# Patient Record
Sex: Female | Born: 1965 | Race: Black or African American | Hispanic: No | Marital: Single | Smoking: Never smoker
Health system: Southern US, Community
[De-identification: ages and names within clinical notes are randomized; demographics above are authoritative.]

## PROBLEM LIST (undated history)

## (undated) DIAGNOSIS — R7303 Prediabetes: Secondary | ICD-10-CM

## (undated) DIAGNOSIS — E785 Hyperlipidemia, unspecified: Secondary | ICD-10-CM

## (undated) DIAGNOSIS — F32A Depression, unspecified: Secondary | ICD-10-CM

## (undated) DIAGNOSIS — I10 Essential (primary) hypertension: Secondary | ICD-10-CM

## (undated) DIAGNOSIS — E739 Lactose intolerance, unspecified: Secondary | ICD-10-CM

## (undated) DIAGNOSIS — E7800 Pure hypercholesterolemia, unspecified: Secondary | ICD-10-CM

## (undated) DIAGNOSIS — F419 Anxiety disorder, unspecified: Secondary | ICD-10-CM

## (undated) DIAGNOSIS — R002 Palpitations: Secondary | ICD-10-CM

## (undated) DIAGNOSIS — E669 Obesity, unspecified: Secondary | ICD-10-CM

## (undated) DIAGNOSIS — G4730 Sleep apnea, unspecified: Secondary | ICD-10-CM

## (undated) DIAGNOSIS — E559 Vitamin D deficiency, unspecified: Secondary | ICD-10-CM

## (undated) HISTORY — DX: Sleep apnea, unspecified: G47.30

## (undated) HISTORY — DX: Lactose intolerance, unspecified: E73.9

## (undated) HISTORY — DX: Anxiety disorder, unspecified: F41.9

## (undated) HISTORY — DX: Palpitations: R00.2

## (undated) HISTORY — DX: Depression, unspecified: F32.A

## (undated) HISTORY — DX: Hyperlipidemia, unspecified: E78.5

## (undated) HISTORY — DX: Obesity, unspecified: E66.9

## (undated) HISTORY — DX: Prediabetes: R73.03

## (undated) HISTORY — DX: Essential (primary) hypertension: I10

## (undated) HISTORY — DX: Pure hypercholesterolemia, unspecified: E78.00

## (undated) HISTORY — DX: Vitamin D deficiency, unspecified: E55.9

---

## 2008-05-30 ENCOUNTER — Encounter: Admission: RE | Admit: 2008-05-30 | Discharge: 2008-05-30 | Payer: Self-pay | Admitting: Internal Medicine

## 2009-03-30 ENCOUNTER — Encounter: Admission: RE | Admit: 2009-03-30 | Discharge: 2009-03-30 | Payer: Self-pay | Admitting: Internal Medicine

## 2009-06-12 ENCOUNTER — Encounter: Admission: RE | Admit: 2009-06-12 | Discharge: 2009-06-12 | Payer: Self-pay | Admitting: Internal Medicine

## 2010-04-30 ENCOUNTER — Encounter: Admission: RE | Admit: 2010-04-30 | Discharge: 2010-04-30 | Payer: Self-pay | Admitting: Family Medicine

## 2010-07-01 ENCOUNTER — Encounter: Admission: RE | Admit: 2010-07-01 | Discharge: 2010-07-01 | Payer: Self-pay | Admitting: Internal Medicine

## 2011-06-12 ENCOUNTER — Other Ambulatory Visit: Payer: Self-pay | Admitting: Internal Medicine

## 2011-06-12 DIAGNOSIS — Z1231 Encounter for screening mammogram for malignant neoplasm of breast: Secondary | ICD-10-CM

## 2011-07-15 ENCOUNTER — Ambulatory Visit
Admission: RE | Admit: 2011-07-15 | Discharge: 2011-07-15 | Disposition: A | Discharge status: Home / Self Care | Payer: BC Managed Care – PPO | Source: Ambulatory Visit | Attending: Internal Medicine | Admitting: Internal Medicine

## 2011-07-15 DIAGNOSIS — Z1231 Encounter for screening mammogram for malignant neoplasm of breast: Secondary | ICD-10-CM

## 2011-09-11 ENCOUNTER — Ambulatory Visit (INDEPENDENT_AMBULATORY_CARE_PROVIDER_SITE_OTHER): Payer: BC Managed Care – PPO

## 2011-09-11 DIAGNOSIS — L501 Idiopathic urticaria: Secondary | ICD-10-CM

## 2014-05-01 ENCOUNTER — Ambulatory Visit
Admission: RE | Admit: 2014-05-01 | Discharge: 2014-05-01 | Disposition: A | Discharge status: Home / Self Care | Payer: BC Managed Care – PPO | Source: Ambulatory Visit | Attending: Orthopedic Surgery | Admitting: Orthopedic Surgery

## 2014-05-01 ENCOUNTER — Other Ambulatory Visit: Payer: Self-pay | Admitting: Orthopedic Surgery

## 2014-05-01 DIAGNOSIS — R52 Pain, unspecified: Secondary | ICD-10-CM

## 2014-05-23 ENCOUNTER — Other Ambulatory Visit: Payer: Self-pay | Admitting: Family Medicine

## 2014-05-23 DIAGNOSIS — Z1239 Encounter for other screening for malignant neoplasm of breast: Secondary | ICD-10-CM

## 2014-06-08 ENCOUNTER — Other Ambulatory Visit: Payer: Self-pay | Admitting: Family Medicine

## 2014-06-08 DIAGNOSIS — Z1231 Encounter for screening mammogram for malignant neoplasm of breast: Secondary | ICD-10-CM

## 2014-06-09 ENCOUNTER — Ambulatory Visit: Payer: BC Managed Care – PPO | Admitting: Dietician

## 2014-06-12 ENCOUNTER — Ambulatory Visit
Admission: RE | Admit: 2014-06-12 | Discharge: 2014-06-12 | Disposition: A | Discharge status: Home / Self Care | Payer: BC Managed Care – PPO | Source: Ambulatory Visit | Attending: Family Medicine | Admitting: Family Medicine

## 2014-06-12 DIAGNOSIS — Z1231 Encounter for screening mammogram for malignant neoplasm of breast: Secondary | ICD-10-CM

## 2014-07-20 ENCOUNTER — Encounter: Payer: BC Managed Care – PPO | Attending: Family Medicine | Admitting: Dietician

## 2014-07-20 ENCOUNTER — Encounter: Payer: Self-pay | Admitting: Dietician

## 2014-07-20 VITALS — Ht 62.0 in | Wt 251.2 lb

## 2014-07-20 DIAGNOSIS — Z6841 Body Mass Index (BMI) 40.0 and over, adult: Secondary | ICD-10-CM | POA: Insufficient documentation

## 2014-07-20 DIAGNOSIS — E669 Obesity, unspecified: Secondary | ICD-10-CM | POA: Insufficient documentation

## 2014-07-20 DIAGNOSIS — Z713 Dietary counseling and surveillance: Secondary | ICD-10-CM | POA: Insufficient documentation

## 2014-07-20 NOTE — Patient Instructions (Addendum)
-  Continue to avoid buying your trigger foods  -Avoid nuts  -Work on developing a routine  -Stash some healthy, high protein snacks at work: string cheese, triscuits, granola bar, Greek yogurt  -Work on managing stress: gum, walking around, have a healthy snack, find a buddy  -Avoid skipping breakfast  -Try Premier protein shake as meal replacement  -Get back into exercise habit  -Try having steamer bags of veggies at work -[employer] Healthy Choice meals on hand at work and at home  -Healthy weight loss: 1-2 lbs per week  -Bariatric support group: [facility] 17 [facility] 6-7 pm [facility] -[facility]

## 2014-07-20 NOTE — Progress Notes (Signed)
  Medical Nutrition Therapy:  Appt start time: 950 end time:  1045.   Assessment:  Primary concerns today: Angela Stanley reports that she is here to discuss her weight. She has received nutrition education in the past. Has tried Weight Watchers, Herbalife, personal trainer, and phentermine. She is currently unsure about bariatric surgery. 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 Barriers: stress, doesn't eat on a schedule. She states that she tries to exercise regularly, walking on treadmill for 40 minutes this morning. She describes herself as a "grazer," likes crunchy snacks and eats excessive amounts of nuts. Angela Stanley lives alone and does not sleep well at night. However, she does not have sleep apnea.     Preferred Learning Style:  No preference indicated   Learning Readiness:   Ready   MEDICATIONS: none   DIETARY INTAKE:  Avoided foods include mushrooms.    24-hr recall:  Wakes up around 6am  B (9:30-10 AM): sometimes skips, milk, 1 cup fruit, 1/2 banana, chia, oats OR yogurt and oats  Snk (11:30 AM): nuts (too many)  L (3-4 PM): varies, chipotle Snk ( PM): granola bars, cookies D ( PM): varies; peanuts or brown rice and black beans Snk ( PM): nuts  Beverages: coffee with sugar free creamer, water, rarely diet coke   Usual physical activity: inconsistent, treadmill  Estimated energy needs: 1600-1800 calories  Progress Towards Goal(s):  In progress.   Nutritional Diagnosis:  [place]-3.3 Overweight/obesity As related to excessive energy intake.  As evidenced by BMI 46.    Intervention:  Nutrition counseling provided. Answered the patient's questions regarding bariatric surgery. Discussed post-op diet progression.  -Continue to avoid buying your trigger foods  -Avoid nuts -Work on developing a routine  -Stash some healthy, high protein snacks at work: string cheese, triscuits, granola bar, Greek yogurt -Work on managing stress: gum, walking around, have a healthy snack, find  a buddy -Avoid skipping breakfast -Try Premier protein shake as meal replacement -Get back into exercise habit -Try having steamer bags of veggies at work -[employer] Healthy Choice meals on hand at work and at home -Healthy weight loss: 1-2 lbs per week  -Bariatric support group: December 17 from 6-7 pm in [facility] -Seminar: [facility]  Sample provided and patient educated on proper use: Vanilla premier protein shake (qty 1) Lot#: [id number] Exp: 12/2014  Teaching Method Utilized:  Visual Auditory Hands on  Barriers to learning/adherence to lifestyle change: stress  Demonstrated degree of understanding via:  Teach Back   Monitoring/Evaluation:  Dietary intake, exercise, and body weight in 4 week(s).

## 2014-08-23 ENCOUNTER — Encounter: Payer: BLUE CROSS/BLUE SHIELD | Attending: Family Medicine | Admitting: Dietician

## 2014-08-23 VITALS — Wt 248.8 lb

## 2014-08-23 DIAGNOSIS — Z713 Dietary counseling and surveillance: Secondary | ICD-10-CM | POA: Insufficient documentation

## 2014-08-23 DIAGNOSIS — E669 Obesity, unspecified: Secondary | ICD-10-CM | POA: Diagnosis present

## 2014-08-23 DIAGNOSIS — Z6841 Body Mass Index (BMI) 40.0 and over, adult: Secondary | ICD-10-CM | POA: Diagnosis not present

## 2014-08-23 NOTE — Progress Notes (Signed)
  Medical Nutrition Therapy:  Appt start time: 335 end time:  405  Follow up:  Angela Stanley returns today with a 3-lbs weight loss. She reports that she is frustrated with slow weight loss and getting bored with food choices. She likes Premier protein shakes and has them between meals or in place of a meal. Started stashing healthy snacks and healthy choice frozen meals at work and in her car. Having balanced dinners with fish and brown rice. Drinking more water. Still snacking at night (emotional eating) on Triscuits. Getting 10,000 steps a day. Has been sleeping more, taking Melatonin.  Preferred Learning Style:  No preference indicated   Learning Readiness:   Ready   MEDICATIONS: none   DIETARY INTAKE:  Avoided foods include mushrooms.    24-hr recall:  Wakes up around 6am  B (9:30-10 AM): Premier protein shake with fruit cup Snk (11:30 AM): 6 Triscuits and string cheese OR KIND bar OR pistachio OR yogurt L (3-4 PM): Healthy Choice frozen meal Snk ( PM): *see morning snack options D ( PM): fish and brown rice OR Premier protein shake Snk ( PM): Triscuits (habitual, boredom)  Beverages: coffee with sugar free creamer, water  Usual physical activity: walking more overall (10,000 steps every day)  Estimated energy needs: 1600-1800 calories  Progress Towards Goal(s):  In progress.   Nutritional Diagnosis:  [place]-3.3 Overweight/obesity As related to excessive energy intake.  As evidenced by BMI 46.    Intervention:  Nutrition counseling provided.   Teaching Method Utilized:  Visual Auditory  Barriers to learning/adherence to lifestyle change: stress  Demonstrated degree of understanding via:  Teach Back   Monitoring/Evaluation:  Dietary intake, exercise, and body weight in 5 week(s).

## 2014-08-23 NOTE — Patient Instructions (Addendum)
-  Continue to avoid buying your trigger foods  -Work on managing stress: gum, walking around, have a healthy snack, find a buddy  -Get back into exercise habit   -Try some sugarfree drinks (sparkling water with fruit pieces) in the evenings for an after dinner treat -Popcorn with smart balance butter in the evenings *eat it very slowly and enjoy it!  -Healthy weight loss: 1-2 lbs per week

## 2014-09-27 ENCOUNTER — Ambulatory Visit: Payer: Self-pay | Admitting: Dietician

## 2014-10-18 ENCOUNTER — Encounter: Payer: BLUE CROSS/BLUE SHIELD | Attending: Family Medicine | Admitting: Dietician

## 2014-10-18 DIAGNOSIS — Z6841 Body Mass Index (BMI) 40.0 and over, adult: Secondary | ICD-10-CM | POA: Diagnosis not present

## 2014-10-18 DIAGNOSIS — Z713 Dietary counseling and surveillance: Secondary | ICD-10-CM | POA: Insufficient documentation

## 2014-10-18 DIAGNOSIS — E669 Obesity, unspecified: Secondary | ICD-10-CM | POA: Diagnosis not present

## 2014-10-18 NOTE — Progress Notes (Signed)
  Medical Nutrition Therapy:  Appt start time: 950 end time:  1025  Follow up:  Angela Stanley returns today with a 1.5 lbs weight loss. She is frustrated and upset about her rate of weight loss. She reports she has been drinking more water but still not enough. Staying more hydrated has helped her cut down on snacking. She is also sleeping better. Now has a fitbit and is tracking foods and steps. Resting heart rate has decreased. Getting 10,000 steps per day. Keeping less trigger foods in the house; however, junk food at work is tempting.   Wt Readings from Last 3 Encounters:  10/18/14 247 lb 6.4 oz (112.22 kg)  08/23/14 248 lb 12.8 oz (112.855 kg)  07/20/14 251 lb 3.2 oz (113.944 kg)   Ht Readings from Last 3 Encounters:  07/20/14 5\' 2"  (1.575 m)   Body mass index is 45.24 kg/(m^2). @BMIFA @ Normalized weight-for-age data available only for age 83 to 20 years. Normalized stature-for-age data available only for age 83 to 20 years.    Preferred Learning Style:  No preference indicated   Learning Readiness:   Ready   MEDICATIONS: none   DIETARY INTAKE:  Avoided foods include mushrooms.    24-hr recall:  Wakes up around 6am  2 cups coffee and sometimes 2-3 cups water B (9 AM): Premier protein shake OR 100 calorie AustriaGreek yogurt with muesli Snk (11:30 AM): 6 Triscuits and string cheese L (3-4 PM): veggie steamer with salmon Snk ( PM): pistachios, sometimes "junk" D (7:30 PM): omelet with Ezekiel bread or salad with chicken Snk ( PM):   Beverages: coffee with sugar free creamer, water  Usual physical activity: walking more overall (10,000 steps every day)  Estimated energy needs: 1600-1800 calories  Progress Towards Goal(s):  In progress.   Nutritional Diagnosis:  [place]-3.3 Overweight/obesity As related to excessive energy intake.  As evidenced by BMI 46.    Intervention:  Nutrition counseling provided.   Teaching Method Utilized:  Visual Auditory  Barriers to  learning/adherence to lifestyle change: stress  Demonstrated degree of understanding via:  Teach Back   Monitoring/Evaluation:  Dietary intake, exercise, and body weight in 6 week(s).

## 2014-10-18 NOTE — Patient Instructions (Signed)
-  Continue to avoid buying your trigger foods  -Work on managing stress: gum, walking around, have a healthy snack, find a buddy  -Get back into exercise habit   -Try some sugarfree drinks (sparkling water with fruit pieces) in the evenings for an after dinner treat -Popcorn with smart balance butter in the evenings *eat it very slowly and enjoy it!  -Healthy weight loss: 1-2 lbs per week

## 2014-11-27 ENCOUNTER — Ambulatory Visit: Payer: BLUE CROSS/BLUE SHIELD | Admitting: Dietician

## 2014-12-21 ENCOUNTER — Ambulatory Visit: Payer: BLUE CROSS/BLUE SHIELD | Admitting: Dietician

## 2018-01-25 ENCOUNTER — Encounter: Payer: 59 | Attending: Internal Medicine | Admitting: Registered"

## 2018-01-25 ENCOUNTER — Encounter: Payer: Self-pay | Admitting: Registered"

## 2018-01-25 DIAGNOSIS — Z713 Dietary counseling and surveillance: Secondary | ICD-10-CM

## 2018-01-25 DIAGNOSIS — E7800 Pure hypercholesterolemia, unspecified: Secondary | ICD-10-CM | POA: Insufficient documentation

## 2018-01-25 NOTE — Progress Notes (Signed)
Medical Nutrition Therapy:  Appt start time: 0835 end time:  0925.  Assessment:  Primary concerns today: Patient states her cholesterol and blood pressure have gone up. Patient states she plans to start taking fish oil to raise HDL. Per labs patient shared: LDL 196; HDL 52;   Patient states that She has been doing Weight Watchers since ~Jan and going to weekly meetings until the last couple of months. Patient states she had lost a little weight in the beginning, but negligible amount recently even when exercising and staying within the point system.   Pt states she has had a chronic cough at night and was assuming it was due to her cats until she noticed her cough went away after she stopped eating Triscuits and Eziekiel bread. Pt states she does not have celiac disease. It could indicate a wheat allergy, but mold allergy may be another explanation. Pt listed mushrooms on her allergy list which could indicate a mold allergy. Both Trisuits and Eziekiel bread do not use preservatives, so it is possible she is getting mold from these sources. RD would like to address this at next visit.  Pt states her sleep is "horrible" and per her fit bit she only got 3 hrs of sleep last night, and rarely will get 8 hrs. Pt states she wakes anywhere from 2:30 - 4am and has a difficult time getting back to sleep. Pt states since Jan she has been using a meditation app in the morning which has helped some.  Patient states her mother is retired, lives with her and has a meal prepared every night. Patient states they are from [place] so some of the foods are traditional with rice or potato (keeping serving size to 1 c), meat and vegetables. Pt states her mother also uses olive oil and they eat avocados regularly.  Patient states she enjoys nuts (almonds), but can't stop at the serving size (100 cal pack) so she just gets nuts for her mother, but avoids them for herself.  Preferred Learning Style:   No preference indicated    Learning Readiness:   Ready  MEDICATIONS: reviewed   DIETARY INTAKE:  Everyday foods include rice, avocadoes.  Avoided foods include (doesn't like veggies but eats them).    24-hr recall:  B (8 AM): blk coffee sometimes stevia, 2 eggs, spinach, olive oil OR oats OR sardines, toast, avocado Snk ( AM): fruit  L ( PM): rice or potato (1 c), steamed veggies (doesn't like), meat Snk ( PM): low energy (2-6pm) snacking on "junk" D (7:15 PM): same as lunch Snk ( PM): none (depends on stress) fruits Beverages: coffee, ~4 water, zevia, Fridays 1-2 glasses of wine  Usual physical activity: walking 5x week 45-60 min   Estimated energy needs: 1800 calories  Progress Towards Goal(s):  New goal.   Nutritional Diagnosis:  [place]-5.8.4 Inconsistent carbohydrate intake As related to high carbodrate snacks to combat low energy in afternoons.  As evidenced by dietary recall.    Intervention:  Nutrition Education. Discussed how inadequate sleep affects energy level and what a person is driven to eat. Discussed role of stress in over all health as well as relationship to food. Discussed the research behind [employer] and realistic weight loss expectations with any commercial program.  Plan: MBSR 8 week course: https://palousemindfulness.com/  Look into the [place] non-hunger eating class Use the Sleep tip sheet, also use the blue light filter on your phone Consider using glass to re-heat food  Teaching Method Utilized:  Visual Auditory Hands on  Handouts given during visit include:  Sleep Hygiene  Barriers to learning/adherence to lifestyle change: none  Demonstrated degree of understanding via:  Teach Back   Monitoring/Evaluation:  Dietary intake, exercise, and body weight in 4 week(s).

## 2018-01-25 NOTE — Patient Instructions (Addendum)
[email or web address].[email or web address] MBSR 8 week course Look into the [place] non-hunger eating class Use the Sleep tip sheet, also use the blue light filter on your phone Consider using glass to re-heat food

## 2018-03-01 ENCOUNTER — Ambulatory Visit: Payer: 59 | Admitting: Registered"

## 2018-03-17 ENCOUNTER — Ambulatory Visit: Payer: Self-pay | Admitting: Registered"

## 2018-04-20 DIAGNOSIS — H9313 Tinnitus, bilateral: Secondary | ICD-10-CM | POA: Diagnosis not present

## 2018-04-20 DIAGNOSIS — H8101 Meniere's disease, right ear: Secondary | ICD-10-CM | POA: Diagnosis not present

## 2018-04-20 DIAGNOSIS — H903 Sensorineural hearing loss, bilateral: Secondary | ICD-10-CM | POA: Diagnosis not present

## 2018-05-11 DIAGNOSIS — H04123 Dry eye syndrome of bilateral lacrimal glands: Secondary | ICD-10-CM | POA: Diagnosis not present

## 2018-05-11 DIAGNOSIS — H2513 Age-related nuclear cataract, bilateral: Secondary | ICD-10-CM | POA: Diagnosis not present

## 2018-05-11 DIAGNOSIS — H25013 Cortical age-related cataract, bilateral: Secondary | ICD-10-CM | POA: Diagnosis not present

## 2018-06-11 DIAGNOSIS — H9312 Tinnitus, left ear: Secondary | ICD-10-CM | POA: Diagnosis not present

## 2018-06-11 DIAGNOSIS — H903 Sensorineural hearing loss, bilateral: Secondary | ICD-10-CM | POA: Diagnosis not present

## 2018-06-14 ENCOUNTER — Other Ambulatory Visit: Payer: Self-pay | Admitting: Otolaryngology

## 2018-06-14 DIAGNOSIS — H9312 Tinnitus, left ear: Secondary | ICD-10-CM

## 2018-06-17 DIAGNOSIS — H04123 Dry eye syndrome of bilateral lacrimal glands: Secondary | ICD-10-CM | POA: Diagnosis not present

## 2018-06-23 ENCOUNTER — Other Ambulatory Visit: Payer: Self-pay

## 2018-07-05 DIAGNOSIS — E7800 Pure hypercholesterolemia, unspecified: Secondary | ICD-10-CM | POA: Diagnosis not present

## 2018-07-05 DIAGNOSIS — Z23 Encounter for immunization: Secondary | ICD-10-CM | POA: Diagnosis not present

## 2018-07-05 DIAGNOSIS — N938 Other specified abnormal uterine and vaginal bleeding: Secondary | ICD-10-CM | POA: Diagnosis not present

## 2018-07-12 DIAGNOSIS — L308 Other specified dermatitis: Secondary | ICD-10-CM | POA: Diagnosis not present

## 2018-07-21 DIAGNOSIS — Z01419 Encounter for gynecological examination (general) (routine) without abnormal findings: Secondary | ICD-10-CM | POA: Diagnosis not present

## 2018-07-21 DIAGNOSIS — Z6841 Body Mass Index (BMI) 40.0 and over, adult: Secondary | ICD-10-CM | POA: Diagnosis not present

## 2018-07-21 DIAGNOSIS — Z124 Encounter for screening for malignant neoplasm of cervix: Secondary | ICD-10-CM | POA: Diagnosis not present

## 2018-07-21 DIAGNOSIS — Z1231 Encounter for screening mammogram for malignant neoplasm of breast: Secondary | ICD-10-CM | POA: Diagnosis not present

## 2018-08-10 DIAGNOSIS — Z1211 Encounter for screening for malignant neoplasm of colon: Secondary | ICD-10-CM | POA: Diagnosis not present

## 2018-09-10 DIAGNOSIS — D123 Benign neoplasm of transverse colon: Secondary | ICD-10-CM | POA: Diagnosis not present

## 2018-09-10 DIAGNOSIS — K635 Polyp of colon: Secondary | ICD-10-CM | POA: Diagnosis not present

## 2018-09-10 DIAGNOSIS — Z1211 Encounter for screening for malignant neoplasm of colon: Secondary | ICD-10-CM | POA: Diagnosis not present

## 2019-06-21 DIAGNOSIS — H2513 Age-related nuclear cataract, bilateral: Secondary | ICD-10-CM | POA: Diagnosis not present

## 2019-06-21 DIAGNOSIS — Q141 Congenital malformation of retina: Secondary | ICD-10-CM | POA: Diagnosis not present

## 2019-06-21 DIAGNOSIS — H35033 Hypertensive retinopathy, bilateral: Secondary | ICD-10-CM | POA: Diagnosis not present

## 2019-06-21 DIAGNOSIS — H04123 Dry eye syndrome of bilateral lacrimal glands: Secondary | ICD-10-CM | POA: Diagnosis not present

## 2019-08-31 DIAGNOSIS — Z124 Encounter for screening for malignant neoplasm of cervix: Secondary | ICD-10-CM | POA: Diagnosis not present

## 2019-08-31 DIAGNOSIS — Z01419 Encounter for gynecological examination (general) (routine) without abnormal findings: Secondary | ICD-10-CM | POA: Diagnosis not present

## 2019-08-31 DIAGNOSIS — Z1231 Encounter for screening mammogram for malignant neoplasm of breast: Secondary | ICD-10-CM | POA: Diagnosis not present

## 2019-09-12 ENCOUNTER — Other Ambulatory Visit: Payer: Self-pay | Admitting: Obstetrics and Gynecology

## 2019-09-12 DIAGNOSIS — N6489 Other specified disorders of breast: Secondary | ICD-10-CM

## 2019-09-23 ENCOUNTER — Other Ambulatory Visit: Payer: Self-pay

## 2019-09-23 ENCOUNTER — Ambulatory Visit
Admission: RE | Admit: 2019-09-23 | Discharge: 2019-09-23 | Disposition: A | Discharge status: Home / Self Care | Payer: BLUE CROSS/BLUE SHIELD | Source: Ambulatory Visit | Attending: Obstetrics and Gynecology | Admitting: Obstetrics and Gynecology

## 2019-09-23 ENCOUNTER — Ambulatory Visit: Payer: BLUE CROSS/BLUE SHIELD

## 2019-09-23 DIAGNOSIS — N6489 Other specified disorders of breast: Secondary | ICD-10-CM

## 2019-09-23 DIAGNOSIS — R928 Other abnormal and inconclusive findings on diagnostic imaging of breast: Secondary | ICD-10-CM | POA: Diagnosis not present

## 2019-10-17 ENCOUNTER — Ambulatory Visit: Payer: BC Managed Care – PPO | Attending: Internal Medicine

## 2019-10-17 ENCOUNTER — Other Ambulatory Visit: Payer: Self-pay

## 2019-10-17 DIAGNOSIS — Z23 Encounter for immunization: Secondary | ICD-10-CM | POA: Insufficient documentation

## 2019-10-17 NOTE — Progress Notes (Signed)
   Covid-19 Vaccination Clinic  Name:  Angela Stanley    MRN: [id number] DOB: April 07, 1966  10/17/2019  Angela Stanley was observed post Covid-19 immunization for 15 minutes without incidence. She was provided with Vaccine Information Sheet and instruction to access the V-Safe system.   Angela Stanley was instructed to call 911 with any severe reactions post vaccine: Marland Kitchen Difficulty breathing  . Swelling of your face and throat  . A fast heartbeat  . A bad rash all over your body  . Dizziness and weakness    Immunizations Administered    Name Date Dose VIS Date Route   Pfizer COVID-19 Vaccine 10/17/2019  1:09 PM 0.3 mL 07/29/2019 Intramuscular   Manufacturer: [employer], [employer]   Lot: [id number]   NDC: [address]-1

## 2019-11-09 ENCOUNTER — Ambulatory Visit: Payer: BC Managed Care – PPO | Attending: Internal Medicine

## 2019-11-09 DIAGNOSIS — Z23 Encounter for immunization: Secondary | ICD-10-CM

## 2019-11-09 NOTE — Progress Notes (Signed)
   Covid-19 Vaccination Clinic  Name:  Angela Stanley    MRN: [id number] DOB: Mar 02, 1966  11/09/2019  Ms. Sporer was observed post Covid-19 immunization for 15 minutes without incident. She was provided with Vaccine Information Sheet and instruction to access the V-Safe system.   Ms. Glasby was instructed to call 911 with any severe reactions post vaccine: Marland Kitchen Difficulty breathing  . Swelling of face and throat  . A fast heartbeat  . A bad rash all over body  . Dizziness and weakness   Immunizations Administered    Name Date Dose VIS Date Route   Pfizer COVID-19 Vaccine 11/09/2019 10:39 AM 0.3 mL 07/29/2019 Intramuscular   Manufacturer: [employer], [employer]   Lot: [id number]   NDC: [address]-8

## 2020-03-20 DIAGNOSIS — E559 Vitamin D deficiency, unspecified: Secondary | ICD-10-CM | POA: Diagnosis not present

## 2020-03-20 DIAGNOSIS — Z0000 Encounter for general adult medical examination without abnormal findings: Secondary | ICD-10-CM | POA: Diagnosis not present

## 2020-03-20 DIAGNOSIS — R7309 Other abnormal glucose: Secondary | ICD-10-CM | POA: Diagnosis not present

## 2020-03-20 DIAGNOSIS — R946 Abnormal results of thyroid function studies: Secondary | ICD-10-CM | POA: Diagnosis not present

## 2020-03-21 DIAGNOSIS — R7309 Other abnormal glucose: Secondary | ICD-10-CM | POA: Diagnosis not present

## 2020-03-21 DIAGNOSIS — E559 Vitamin D deficiency, unspecified: Secondary | ICD-10-CM | POA: Diagnosis not present

## 2020-03-21 DIAGNOSIS — Z0000 Encounter for general adult medical examination without abnormal findings: Secondary | ICD-10-CM | POA: Diagnosis not present

## 2020-03-21 DIAGNOSIS — R946 Abnormal results of thyroid function studies: Secondary | ICD-10-CM | POA: Diagnosis not present

## 2020-04-29 DIAGNOSIS — H6120 Impacted cerumen, unspecified ear: Secondary | ICD-10-CM | POA: Diagnosis not present

## 2020-04-29 DIAGNOSIS — H938X3 Other specified disorders of ear, bilateral: Secondary | ICD-10-CM | POA: Diagnosis not present

## 2020-04-29 DIAGNOSIS — R0982 Postnasal drip: Secondary | ICD-10-CM | POA: Diagnosis not present

## 2020-04-29 DIAGNOSIS — R0981 Nasal congestion: Secondary | ICD-10-CM | POA: Diagnosis not present

## 2020-05-08 DIAGNOSIS — K219 Gastro-esophageal reflux disease without esophagitis: Secondary | ICD-10-CM | POA: Insufficient documentation

## 2020-05-08 DIAGNOSIS — H9312 Tinnitus, left ear: Secondary | ICD-10-CM | POA: Diagnosis not present

## 2020-05-08 DIAGNOSIS — L299 Pruritus, unspecified: Secondary | ICD-10-CM | POA: Diagnosis not present

## 2020-05-13 DIAGNOSIS — R030 Elevated blood-pressure reading, without diagnosis of hypertension: Secondary | ICD-10-CM | POA: Diagnosis not present

## 2020-05-14 ENCOUNTER — Emergency Department (HOSPITAL_COMMUNITY): Payer: BC Managed Care – PPO

## 2020-05-14 DIAGNOSIS — M79602 Pain in left arm: Secondary | ICD-10-CM | POA: Diagnosis not present

## 2020-05-14 DIAGNOSIS — R202 Paresthesia of skin: Secondary | ICD-10-CM | POA: Insufficient documentation

## 2020-05-14 DIAGNOSIS — Z5321 Procedure and treatment not carried out due to patient leaving prior to being seen by health care provider: Secondary | ICD-10-CM | POA: Diagnosis not present

## 2020-05-14 DIAGNOSIS — R079 Chest pain, unspecified: Secondary | ICD-10-CM | POA: Insufficient documentation

## 2020-05-14 DIAGNOSIS — R519 Headache, unspecified: Secondary | ICD-10-CM | POA: Insufficient documentation

## 2020-05-14 DIAGNOSIS — R002 Palpitations: Secondary | ICD-10-CM | POA: Diagnosis not present

## 2020-05-15 ENCOUNTER — Emergency Department (HOSPITAL_COMMUNITY)
Admission: EM | Admit: 2020-05-15 | Discharge: 2020-05-15 | Disposition: A | Discharge status: Home / Self Care | Payer: BC Managed Care – PPO | Attending: Emergency Medicine | Admitting: Emergency Medicine

## 2020-05-15 ENCOUNTER — Encounter (HOSPITAL_COMMUNITY): Payer: Self-pay | Admitting: Emergency Medicine

## 2020-05-15 ENCOUNTER — Other Ambulatory Visit: Payer: Self-pay

## 2020-05-15 DIAGNOSIS — F430 Acute stress reaction: Secondary | ICD-10-CM | POA: Diagnosis not present

## 2020-05-15 DIAGNOSIS — J3089 Other allergic rhinitis: Secondary | ICD-10-CM | POA: Diagnosis not present

## 2020-05-15 DIAGNOSIS — I10 Essential (primary) hypertension: Secondary | ICD-10-CM | POA: Diagnosis not present

## 2020-05-15 LAB — CBC
HCT: 40.6 % (ref 36.0–46.0)
Hemoglobin: 13.3 g/dL (ref 12.0–15.0)
MCH: 28.9 pg (ref 26.0–34.0)
MCHC: 32.8 g/dL (ref 30.0–36.0)
MCV: 88.1 fL (ref 80.0–100.0)
Platelets: 251 10*3/uL (ref 150–400)
RBC: 4.61 MIL/uL (ref 3.87–5.11)
RDW: 14.3 % (ref 11.5–15.5)
WBC: 7.1 10*3/uL (ref 4.0–10.5)
nRBC: 0 % (ref 0.0–0.2)

## 2020-05-15 LAB — TROPONIN I (HIGH SENSITIVITY): Troponin I (High Sensitivity): <2 ng/L (ref <18)

## 2020-05-15 LAB — I-STAT BETA HCG BLOOD, ED (NOT ORDERABLE): I-stat hCG, quantitative: <5 m[IU]/mL (ref <5)

## 2020-05-15 LAB — BASIC METABOLIC PANEL
Anion gap: 9 (ref 5–15)
BUN: 10 mg/dL (ref 6–20)
CO2: 25 mmol/L (ref 22–32)
Calcium: 8.9 mg/dL (ref 8.9–10.3)
Chloride: 102 mmol/L (ref 98–111)
Creatinine, Ser: 0.63 mg/dL (ref 0.44–1.00)
GFR calc Af Amer: >60 mL/min (ref >60)
GFR calc non Af Amer: >60 mL/min (ref >60)
Glucose, Bld: 124 mg/dL — ABNORMAL HIGH (ref 70–99)
Potassium: 3.2 mmol/L — ABNORMAL LOW (ref 3.5–5.1)
Sodium: 136 mmol/L (ref 135–145)

## 2020-05-15 NOTE — ED Triage Notes (Signed)
Patient complaining of chest pain for a week with headaches, sore left arm, right arm numb. Patient states blood pressure has been fluctuating for the last few day.

## 2020-05-24 DIAGNOSIS — I10 Essential (primary) hypertension: Secondary | ICD-10-CM | POA: Diagnosis not present

## 2020-05-24 DIAGNOSIS — R9431 Abnormal electrocardiogram [ECG] [EKG]: Secondary | ICD-10-CM | POA: Diagnosis not present

## 2020-05-24 DIAGNOSIS — F430 Acute stress reaction: Secondary | ICD-10-CM | POA: Diagnosis not present

## 2020-05-24 DIAGNOSIS — E7800 Pure hypercholesterolemia, unspecified: Secondary | ICD-10-CM | POA: Diagnosis not present

## 2020-05-28 ENCOUNTER — Ambulatory Visit (INDEPENDENT_AMBULATORY_CARE_PROVIDER_SITE_OTHER): Payer: BC Managed Care – PPO | Admitting: Family Medicine

## 2020-05-28 ENCOUNTER — Other Ambulatory Visit: Payer: Self-pay

## 2020-05-28 ENCOUNTER — Encounter: Payer: Self-pay | Admitting: Family Medicine

## 2020-05-28 VITALS — BP 146/76 | HR 77 | Temp 97.3°F | Resp 17 | Ht 62.0 in | Wt 254.0 lb

## 2020-05-28 DIAGNOSIS — Z09 Encounter for follow-up examination after completed treatment for conditions other than malignant neoplasm: Secondary | ICD-10-CM

## 2020-05-28 DIAGNOSIS — I10 Essential (primary) hypertension: Secondary | ICD-10-CM | POA: Diagnosis not present

## 2020-05-28 DIAGNOSIS — R8290 Unspecified abnormal findings in urine: Secondary | ICD-10-CM

## 2020-05-28 DIAGNOSIS — F419 Anxiety disorder, unspecified: Secondary | ICD-10-CM

## 2020-05-28 DIAGNOSIS — Z7689 Persons encountering health services in other specified circumstances: Secondary | ICD-10-CM

## 2020-05-28 LAB — POCT URINALYSIS DIPSTICK
Bilirubin, UA: NEGATIVE
Blood, UA: NEGATIVE
Glucose, UA: NEGATIVE
Ketones, UA: NEGATIVE
Nitrite, UA: NEGATIVE
Protein, UA: POSITIVE — AB
Spec Grav, UA: 1.02 (ref 1.010–1.025)
Urobilinogen, UA: 0.2 E.U./dL
pH, UA: 7 (ref 5.0–8.0)

## 2020-05-28 LAB — GLUCOSE, POCT (MANUAL RESULT ENTRY): POC Glucose: 146 mg/dl — AB (ref 70–99)

## 2020-05-28 LAB — POCT GLYCOSYLATED HEMOGLOBIN (HGB A1C)
HbA1c POC (<> result, manual entry): 5.6 % (ref 4.0–5.6)
HbA1c, POC (controlled diabetic range): 5.6 % (ref 0.0–7.0)
HbA1c, POC (prediabetic range): 5.6 % — AB (ref 5.7–6.4)
Hemoglobin A1C: 5.6 % (ref 4.0–5.6)

## 2020-05-28 MED ORDER — BISOPROLOL FUMARATE 5 MG PO TABS: 5.0000 mg | ORAL_TABLET | Freq: Every day | ORAL | 3 refills | Status: DC

## 2020-05-28 NOTE — Progress Notes (Signed)
Virtual Visit via Telephone Note  I connected with Angela Stanley on 06/03/20 at  3:00 PM EDT by telephone and verified that I am speaking with the correct person using two identifiers.   I discussed the limitations, risks, security and privacy concerns of performing an evaluation and management service by telephone and the availability of in person appointments. I also discussed with the patient that there may be a patient responsible charge related to this service. The patient expressed understanding and agreed to proceed.   Televisit Today Patient Location: Home Provider Location: Office   History of Present Illness:  There are no problems to display for this patient.   Current Status: This will be Angela Stanley's initial office visit with me. She was previously seeing Irena Reichmann, DO for her PCP needs. Since her last office visit, she has had an office visit for Panic Attack and Anxiety on 05/15/2020. Today, she is doing well with no complaints. She denies fevers, chills, fatigue, recent infections, weight loss, and night sweats. She has not had any headaches, visual changes, dizziness, and falls. No chest pain, heart palpitations, cough and shortness of breath reported. No reports of GI problems such as nausea, vomiting, diarrhea, and constipation. She has no reports of blood in stools, dysuria and hematuria. No depression or anxiety, and denies suicidal ideations, homicidal ideations, or auditory hallucinations. She is taking all medications as prescribed. She denies pain today.     Observations/Objective:  Telephone Visit   Assessment and Plan:  1. Hypertension, unspecified type He will continue to take medications as prescribed, to decrease high sodium intake, excessive alcohol intake, increase potassium intake, smoking cessation, and increase physical activity of at least 30 minutes of cardio activity daily. He will continue to follow Heart Healthy or DASH diet. -  bisoprolol (ZEBETA) 5 MG tablet; Take 1 tablet (5 mg total) by mouth daily.  Dispense: 90 tablet; Refill: 3  2. Anxiety - Ambulatory referral to Psychiatry  3. Abnormal urine finding - Urine Culture  4. Follow up She will follow up in 1 month.   Meds ordered this encounter  Medications  . bisoprolol (ZEBETA) 5 MG tablet    Sig: Take 1 tablet (5 mg total) by mouth daily.    Dispense:  90 tablet    Refill:  3    Orders Placed This Encounter  Procedures  . Urine Culture  . Ambulatory referral to Psychiatry     Referral Orders     Ambulatory referral to Psychiatry   Raliegh Ip,  MSN, FNP-BC [facility]/Internal Medicine/[facility] [address] [place], [place] [address] [phone]- fax   I discussed the assessment and treatment plan with the patient. The patient was provided an opportunity to ask questions and all were answered. The patient agreed with the plan and demonstrated an understanding of the instructions.   The patient was advised to call back or seek an in-person evaluation if the symptoms worsen or if the condition fails to improve as anticipated.  I provided 20 minutes of non-face-to-face time during this encounter.   Kallie Locks, FNP

## 2020-05-29 ENCOUNTER — Encounter: Payer: Self-pay | Admitting: Family Medicine

## 2020-05-30 ENCOUNTER — Other Ambulatory Visit: Payer: Self-pay | Admitting: Family Medicine

## 2020-05-30 DIAGNOSIS — G4700 Insomnia, unspecified: Secondary | ICD-10-CM

## 2020-05-30 LAB — URINE CULTURE

## 2020-05-30 MED ORDER — TRAZODONE HCL 50 MG PO TABS: 50.0000 mg | ORAL_TABLET | Freq: Every evening | ORAL | 3 refills | Status: DC | PRN

## 2020-06-03 ENCOUNTER — Encounter: Payer: Self-pay | Admitting: Family Medicine

## 2020-06-12 DIAGNOSIS — R7309 Other abnormal glucose: Secondary | ICD-10-CM | POA: Diagnosis not present

## 2020-06-12 DIAGNOSIS — E782 Mixed hyperlipidemia: Secondary | ICD-10-CM | POA: Diagnosis not present

## 2020-06-12 DIAGNOSIS — J309 Allergic rhinitis, unspecified: Secondary | ICD-10-CM | POA: Diagnosis not present

## 2020-06-15 ENCOUNTER — Encounter: Payer: Self-pay | Admitting: Family Medicine

## 2020-06-18 DIAGNOSIS — F419 Anxiety disorder, unspecified: Secondary | ICD-10-CM | POA: Diagnosis not present

## 2020-06-22 DIAGNOSIS — H524 Presbyopia: Secondary | ICD-10-CM | POA: Diagnosis not present

## 2020-06-22 DIAGNOSIS — H04123 Dry eye syndrome of bilateral lacrimal glands: Secondary | ICD-10-CM | POA: Diagnosis not present

## 2020-06-22 DIAGNOSIS — Q142 Congenital malformation of optic disc: Secondary | ICD-10-CM | POA: Diagnosis not present

## 2020-06-22 DIAGNOSIS — H25013 Cortical age-related cataract, bilateral: Secondary | ICD-10-CM | POA: Diagnosis not present

## 2020-06-22 DIAGNOSIS — H35013 Changes in retinal vascular appearance, bilateral: Secondary | ICD-10-CM | POA: Diagnosis not present

## 2020-06-22 DIAGNOSIS — H2513 Age-related nuclear cataract, bilateral: Secondary | ICD-10-CM | POA: Diagnosis not present

## 2020-06-22 DIAGNOSIS — Q141 Congenital malformation of retina: Secondary | ICD-10-CM | POA: Diagnosis not present

## 2020-06-22 DIAGNOSIS — H35033 Hypertensive retinopathy, bilateral: Secondary | ICD-10-CM | POA: Diagnosis not present

## 2020-06-25 ENCOUNTER — Other Ambulatory Visit: Payer: Self-pay | Admitting: Family Medicine

## 2020-06-25 DIAGNOSIS — F419 Anxiety disorder, unspecified: Secondary | ICD-10-CM | POA: Diagnosis not present

## 2020-06-25 DIAGNOSIS — G4700 Insomnia, unspecified: Secondary | ICD-10-CM

## 2020-06-26 ENCOUNTER — Ambulatory Visit: Payer: BC Managed Care – PPO | Admitting: Family Medicine

## 2020-07-02 DIAGNOSIS — F419 Anxiety disorder, unspecified: Secondary | ICD-10-CM | POA: Diagnosis not present

## 2020-07-02 DIAGNOSIS — H903 Sensorineural hearing loss, bilateral: Secondary | ICD-10-CM | POA: Diagnosis not present

## 2020-07-02 DIAGNOSIS — H9313 Tinnitus, bilateral: Secondary | ICD-10-CM | POA: Diagnosis not present

## 2020-07-02 DIAGNOSIS — R42 Dizziness and giddiness: Secondary | ICD-10-CM | POA: Diagnosis not present

## 2020-07-02 DIAGNOSIS — H608X3 Other otitis externa, bilateral: Secondary | ICD-10-CM | POA: Diagnosis not present

## 2020-07-04 ENCOUNTER — Other Ambulatory Visit: Payer: Self-pay | Admitting: Otolaryngology

## 2020-07-04 DIAGNOSIS — H903 Sensorineural hearing loss, bilateral: Secondary | ICD-10-CM

## 2020-07-04 DIAGNOSIS — H9041 Sensorineural hearing loss, unilateral, right ear, with unrestricted hearing on the contralateral side: Secondary | ICD-10-CM

## 2020-07-16 DIAGNOSIS — F419 Anxiety disorder, unspecified: Secondary | ICD-10-CM | POA: Diagnosis not present

## 2020-07-18 DIAGNOSIS — G4700 Insomnia, unspecified: Secondary | ICD-10-CM | POA: Diagnosis not present

## 2020-07-23 DIAGNOSIS — F419 Anxiety disorder, unspecified: Secondary | ICD-10-CM | POA: Diagnosis not present

## 2020-07-27 ENCOUNTER — Ambulatory Visit
Admission: RE | Admit: 2020-07-27 | Discharge: 2020-07-27 | Disposition: A | Discharge status: Home / Self Care | Payer: BC Managed Care – PPO | Source: Ambulatory Visit | Attending: Otolaryngology | Admitting: Otolaryngology

## 2020-07-27 ENCOUNTER — Other Ambulatory Visit: Payer: Self-pay

## 2020-07-27 DIAGNOSIS — H903 Sensorineural hearing loss, bilateral: Secondary | ICD-10-CM

## 2020-07-27 DIAGNOSIS — H9041 Sensorineural hearing loss, unilateral, right ear, with unrestricted hearing on the contralateral side: Secondary | ICD-10-CM

## 2020-07-27 MED ORDER — GADOBENATE DIMEGLUMINE 529 MG/ML IV SOLN
20.0000 mL | Freq: Once | INTRAVENOUS | Status: AC | PRN
  Administered 2020-07-27: 20 mL via INTRAVENOUS

## 2020-07-30 DIAGNOSIS — F419 Anxiety disorder, unspecified: Secondary | ICD-10-CM | POA: Diagnosis not present

## 2020-08-29 NOTE — Progress Notes (Unsigned)
Patient referred by Andi Devon, MD for irregular heart beat  Subjective:   Angela Stanley, female    DOB: 1966/04/02, 55 y.o.   MRN: [id number]   Chief Complaint  Patient presents with  . Irregular Heart Beat  . New Patient (Initial Visit)     HPI  55 y.o. African American  female with hypertension, mixed hyperlipidemia irregular heart beat  Patient works for [employer], states her job is stressful.  She has strong family history of atherosclerotic cardiovascular disease.  Her sister recently had a stroke in her early 26s, father had coronary artery disease around age 95.  Patient stays pretty active with regular Peloton bike exercises and walking.  Recently, she has experienced right-sided chest pain unrelated to exertion.  In addition, she has noticed palpitations with heart rate variation that also seem to occur unrelated to exertion.  She has noticed that her heart rate variation, and tinnitus, started after second dose of her mRNA vaccine.  Therefore, she is not willing to take the booster at this time.  Reviewed external labs with the patient, details below.  Past Medical History:  Diagnosis Date  . Hyperlipidemia   . Obesity      History reviewed. No pertinent surgical history.   Social History   Tobacco Use  Smoking Status Never Smoker  Smokeless Tobacco Never Used    Social History   Substance and Sexual Activity  Alcohol Use Never     Family History  Problem Relation Age of Onset  . Diabetes Other   . Hyperlipidemia Other   . Heart attack Other   . Obesity Other   . Hypertension Mother   . Heart attack Father   . Stroke Sister   . Diabetes Sister   . Hypertension Sister   . Hypertension Brother      Current Outpatient Medications on File Prior to Visit  Medication Sig Dispense Refill  . Eszopiclone 3 MG TABS Take 3 mg by mouth at bedtime as needed.    . Multiple Vitamin (MULTIVITAMINS PO) Take by mouth.    .  nebivolol (BYSTOLIC) 10 MG tablet     . progesterone (PROMETRIUM) 100 MG capsule See admin instructions.    . traZODone (DESYREL) 50 MG tablet Take 1 tablet (50 mg total) by mouth at bedtime as needed for sleep. 30 tablet 3  . Vitamin D, Ergocalciferol, (DRISDOL) 1.25 MG (50000 UNIT) CAPS capsule Take 50,000 Units by mouth every 7 (seven) days.     No current facility-administered medications on file prior to visit.    Cardiovascular and other pertinent studies:   EKG 08/28/2020: Sinus rhythm 71 bpm Normal EKG   Recent labs: 06/12/2020:  Chol 253, TG 109, HDL 53, LDL 181 Apolopoprotein 154 (<90) CRP 15 TSH 1.160 normal   Review of Systems  Cardiovascular: Positive for chest pain and palpitations. Negative for dyspnea on exertion, leg swelling and syncope.         Vitals:   08/30/20 1452  BP: 135/69  Pulse: 74  Resp: 16  Temp: 97.7 F (36.5 C)  SpO2: 91%     Body mass index is 45.18 kg/m. Filed Weights   08/30/20 1452  Weight: 247 lb (112 kg)     Objective:   Physical Exam Vitals and nursing note reviewed.  Constitutional:      General: She is not in acute distress. Neck:     Vascular: No JVD.  Cardiovascular:     Rate and Rhythm:  Normal rate and regular rhythm.     Heart sounds: Normal heart sounds. No murmur heard.   Pulmonary:     Effort: Pulmonary effort is normal.     Breath sounds: Normal breath sounds. No wheezing or rales.  Musculoskeletal:     Right lower leg: No edema.     Left lower leg: No edema.            Assessment & Recommendations:   54 y.o. African American  female with hypertension, mixed hyperlipidemia, irregular heart beat, family h/o ASCVD  Chest pain: Atypical.  However, she has risk factors for CAD, including hyperlipidemia and family history.  Will obtain exercise treadmill stress test, calcium score scan.  Palpitations: Patient currently wears an apple watch.  I have invited her to participate in cardio logs  remote patient monitoring.  Mixed hyperlipidemia: 10 yr ASCVD risk of 6%. She is on the fence regarding initiation of lipid-lowering therapy.  Calcium score scan will help in recertification.  Further recommendations after above testing  Thank you for referring the patient to [place]. Please feel free to contact with any questions.   Elder Negus, MD Pager: [phone] Office: [phone]

## 2020-08-30 ENCOUNTER — Ambulatory Visit: Payer: BC Managed Care – PPO | Admitting: Cardiology

## 2020-08-30 ENCOUNTER — Other Ambulatory Visit: Payer: Self-pay

## 2020-08-30 ENCOUNTER — Encounter: Payer: Self-pay | Admitting: Cardiology

## 2020-08-30 VITALS — BP 135/69 | HR 74 | Temp 97.7°F | Resp 16 | Ht 62.0 in | Wt 247.0 lb

## 2020-08-30 DIAGNOSIS — R072 Precordial pain: Secondary | ICD-10-CM | POA: Insufficient documentation

## 2020-08-30 DIAGNOSIS — E782 Mixed hyperlipidemia: Secondary | ICD-10-CM | POA: Insufficient documentation

## 2020-09-05 ENCOUNTER — Ambulatory Visit (HOSPITAL_COMMUNITY)
Admission: RE | Admit: 2020-09-05 | Discharge: 2020-09-05 | Disposition: A | Discharge status: Home / Self Care | Payer: BC Managed Care – PPO | Source: Ambulatory Visit | Attending: Cardiology | Admitting: Cardiology

## 2020-09-05 ENCOUNTER — Other Ambulatory Visit: Payer: Self-pay

## 2020-09-05 ENCOUNTER — Ambulatory Visit (HOSPITAL_COMMUNITY): Admission: RE | Admit: 2020-09-05 | Payer: BC Managed Care – PPO | Source: Ambulatory Visit

## 2020-09-05 ENCOUNTER — Encounter (HOSPITAL_COMMUNITY): Payer: Self-pay

## 2020-09-05 DIAGNOSIS — R072 Precordial pain: Secondary | ICD-10-CM | POA: Insufficient documentation

## 2020-09-05 DIAGNOSIS — E782 Mixed hyperlipidemia: Secondary | ICD-10-CM | POA: Diagnosis not present

## 2020-09-07 ENCOUNTER — Other Ambulatory Visit (HOSPITAL_COMMUNITY): Payer: Self-pay

## 2020-09-10 ENCOUNTER — Other Ambulatory Visit (HOSPITAL_COMMUNITY)
Admission: RE | Admit: 2020-09-10 | Discharge: 2020-09-10 | Disposition: A | Discharge status: Home / Self Care | Payer: BC Managed Care – PPO | Source: Ambulatory Visit | Attending: Cardiology | Admitting: Cardiology

## 2020-09-10 DIAGNOSIS — Z01812 Encounter for preprocedural laboratory examination: Secondary | ICD-10-CM | POA: Insufficient documentation

## 2020-09-10 DIAGNOSIS — Z20822 Contact with and (suspected) exposure to covid-19: Secondary | ICD-10-CM | POA: Insufficient documentation

## 2020-09-10 LAB — SARS CORONAVIRUS 2 (TAT 6-24 HRS): SARS Coronavirus 2: NEGATIVE

## 2020-09-12 ENCOUNTER — Other Ambulatory Visit: Payer: Self-pay

## 2020-09-12 ENCOUNTER — Ambulatory Visit: Payer: BC Managed Care – PPO

## 2020-09-12 DIAGNOSIS — E782 Mixed hyperlipidemia: Secondary | ICD-10-CM

## 2020-09-12 DIAGNOSIS — R072 Precordial pain: Secondary | ICD-10-CM

## 2020-09-13 NOTE — Progress Notes (Signed)
Low exercise capacity likely due to deconditioning. Calcium score 0. Continue diet and lifestyle modifications. Discussed with the patient.   Regards, Dr. Rosemary Holms

## 2020-09-21 ENCOUNTER — Other Ambulatory Visit (HOSPITAL_BASED_OUTPATIENT_CLINIC_OR_DEPARTMENT_OTHER): Payer: Self-pay

## 2020-09-21 DIAGNOSIS — G4700 Insomnia, unspecified: Secondary | ICD-10-CM

## 2020-09-21 DIAGNOSIS — R0683 Snoring: Secondary | ICD-10-CM

## 2020-09-21 DIAGNOSIS — R0689 Other abnormalities of breathing: Secondary | ICD-10-CM

## 2020-09-21 DIAGNOSIS — G4710 Hypersomnia, unspecified: Secondary | ICD-10-CM

## 2020-10-08 ENCOUNTER — Ambulatory Visit (HOSPITAL_BASED_OUTPATIENT_CLINIC_OR_DEPARTMENT_OTHER): Payer: BC Managed Care – PPO | Attending: Internal Medicine | Admitting: Internal Medicine

## 2020-10-08 ENCOUNTER — Other Ambulatory Visit: Payer: Self-pay

## 2020-10-08 DIAGNOSIS — G478 Other sleep disorders: Secondary | ICD-10-CM | POA: Diagnosis not present

## 2020-10-08 DIAGNOSIS — R0683 Snoring: Secondary | ICD-10-CM

## 2020-10-08 DIAGNOSIS — G4733 Obstructive sleep apnea (adult) (pediatric): Secondary | ICD-10-CM | POA: Insufficient documentation

## 2020-10-08 DIAGNOSIS — R0689 Other abnormalities of breathing: Secondary | ICD-10-CM

## 2020-10-08 DIAGNOSIS — G4710 Hypersomnia, unspecified: Secondary | ICD-10-CM | POA: Insufficient documentation

## 2020-10-08 DIAGNOSIS — G4700 Insomnia, unspecified: Secondary | ICD-10-CM

## 2020-10-08 DIAGNOSIS — R0681 Apnea, not elsewhere classified: Secondary | ICD-10-CM

## 2020-10-09 ENCOUNTER — Ambulatory Visit: Payer: BC Managed Care – PPO | Admitting: Diagnostic Neuroimaging

## 2020-10-10 ENCOUNTER — Ambulatory Visit: Payer: BC Managed Care – PPO | Admitting: Cardiology

## 2020-10-11 DIAGNOSIS — H9319 Tinnitus, unspecified ear: Secondary | ICD-10-CM | POA: Insufficient documentation

## 2020-10-14 NOTE — Procedures (Signed)
   NAME: Angela Stanley DATE OF BIRTH:  [birth year] MEDICAL RECORD NUMBER [id number]  LOCATION: [facility]  PHYSICIAN: Deretha Emory  DATE OF STUDY: 10/08/2020  SLEEP STUDY TYPE: Nocturnal Polysomnogram               REFERRING PHYSICIAN: Tempie Hoist, MD/Jen Yehuda Mao PA-C   EPWORTH SLEEPINESS SCORE:  10 HEIGHT: 5\' 2"  (157.5 cm)  WEIGHT: 245 lb (111.1 kg)    Body mass index is 44.81 kg/m.  NECK SIZE: 15.5 in.  CLINICAL INFORMATION The patient was referred to the sleep center for PSG. She has a history of insomnia, excessive daytime sleepiness. Most recent home sleep apnea test dated 09/17/2020 revealed an AHI of 3/h.  MEDICATIONS Patient self administered medications include: LUNESTA, PROGESTERONE.  SLEEP STUDY TECHNIQUE A multi-channel overnight Polysomnography study was performed. The channels recorded and monitored were central and occipital EEG, electrooculogram (EOG), submentalis EMG (chin), nasal and oral airflow, thoracic and abdominal wall motion, anterior tibialis EMG, snore microphone, electrocardiogram, and a pulse oximetry.  TECHNICAL COMMENTS Comments added by Technician: NONE Comments added by Scorer: N/A  SLEEP ARCHITECTURE The study was initiated at 9:50:35 PM and terminated at 4:02:35 AM. The total recorded time was 372 minutes. EEG confirmed total sleep time was 276 minutes yielding a sleep efficiency of 74.2%%. Sleep onset after lights out was 6.3 minutes with a REM latency of 70.5 minutes. The patient spent 8.0%% of the night in stage N1 sleep, 75.2%% in stage N2 sleep, 0.0%% in stage N3 and 16.9% in REM. Wake after sleep onset (WASO) was 89.7 minutes. The Arousal Index was 20.4/hour.  RESPIRATORY PARAMETERS There were a total of 10 respiratory disturbances out of which 0 were apneas ( 0 obstructive, 0 mixed, 0 central) and 10 hypopneas. The apnea/hypopnea index (AHI) was 2.2 events/hour. The central sleep apnea index was 0.0  events/hour. The REM AHI was 12.9 events/hour and NREM AHI was 0.0 events/hour. The supine AHI was 1.3 events/hour and the non supine AHI was 4 events/hour. Respiratory Disturbance Index (RDI) was 13 events/hour overall and 33 events/hour in REM sleep. Respiratory disturbances were associated with oxygen desaturation down to a nadir of 89.0% during sleep. The mean oxygen saturation during the study was 95.5%. The cumulative time under 88% oxygen saturation was 0 minutes.  LEG MOVEMENT DATA The total leg movements were 0 with a resulting leg movement index of 0.0/hr . Associated arousal with leg movement index was 0.0/hr.  CARDIAC DATA The underlying cardiac rhythm was most consistent with sinus rhythm. Mean heart rate during sleep was 59.5 bpm. Additional rhythm abnormalities include None.  IMPRESSIONS - No Significant Obstructive Sleep apnea (OSA) by AHI with the 3% or 4% rule; mild Obstructive Sleep Apnea by RDI with the 3% or 4% rule - No Significant Central Sleep Apnea (CSA) - No hypoxemia - In the old nomenclature, this would have been described as Mild Upper Airway Resistance Syndrome (UARS). - No significant periodic leg movements(PLMs) during sleep.  DIAGNOSIS - Mild obstructive sleep apnea by RDI only. - Upper Airway Resistance Syndrome (G47.8)  RECOMMENDATIONS - Treatment options include weight loss, oral appliance, or CPAP. It is unknown if her insurance would cover CPAP for OSA diagnosed by RDI alone.  03-26-1972 Sleep specialist, American Board of Internal Medicine  ELECTRONICALLY SIGNED ON:  10/14/2020, 7:50 AM [facility] PH: [phone]   FX: [id number] ACCREDITED BY THE AMERICAN ACADEMY OF SLEEP MEDICINE

## 2021-09-30 DIAGNOSIS — F419 Anxiety disorder, unspecified: Secondary | ICD-10-CM | POA: Diagnosis not present

## 2021-10-14 DIAGNOSIS — F419 Anxiety disorder, unspecified: Secondary | ICD-10-CM | POA: Diagnosis not present

## 2021-10-16 DIAGNOSIS — N951 Menopausal and female climacteric states: Secondary | ICD-10-CM | POA: Diagnosis not present

## 2021-10-16 DIAGNOSIS — Z1239 Encounter for other screening for malignant neoplasm of breast: Secondary | ICD-10-CM | POA: Diagnosis not present

## 2021-10-16 DIAGNOSIS — Z01419 Encounter for gynecological examination (general) (routine) without abnormal findings: Secondary | ICD-10-CM | POA: Diagnosis not present

## 2021-10-16 DIAGNOSIS — Z113 Encounter for screening for infections with a predominantly sexual mode of transmission: Secondary | ICD-10-CM | POA: Diagnosis not present

## 2021-10-16 DIAGNOSIS — Z1231 Encounter for screening mammogram for malignant neoplasm of breast: Secondary | ICD-10-CM | POA: Diagnosis not present

## 2021-11-05 DIAGNOSIS — R222 Localized swelling, mass and lump, trunk: Secondary | ICD-10-CM | POA: Diagnosis not present

## 2021-11-05 DIAGNOSIS — R928 Other abnormal and inconclusive findings on diagnostic imaging of breast: Secondary | ICD-10-CM | POA: Diagnosis not present

## 2021-12-09 DIAGNOSIS — F419 Anxiety disorder, unspecified: Secondary | ICD-10-CM | POA: Diagnosis not present

## 2021-12-11 DIAGNOSIS — I10 Essential (primary) hypertension: Secondary | ICD-10-CM | POA: Diagnosis not present

## 2021-12-11 DIAGNOSIS — Z6841 Body Mass Index (BMI) 40.0 and over, adult: Secondary | ICD-10-CM | POA: Diagnosis not present

## 2021-12-23 DIAGNOSIS — F419 Anxiety disorder, unspecified: Secondary | ICD-10-CM | POA: Diagnosis not present

## 2022-01-16 DIAGNOSIS — H16223 Keratoconjunctivitis sicca, not specified as Sjogren's, bilateral: Secondary | ICD-10-CM | POA: Diagnosis not present

## 2022-01-16 DIAGNOSIS — Q141 Congenital malformation of retina: Secondary | ICD-10-CM | POA: Diagnosis not present

## 2022-01-16 DIAGNOSIS — H04123 Dry eye syndrome of bilateral lacrimal glands: Secondary | ICD-10-CM | POA: Diagnosis not present

## 2022-01-16 DIAGNOSIS — H35013 Changes in retinal vascular appearance, bilateral: Secondary | ICD-10-CM | POA: Diagnosis not present

## 2022-01-16 DIAGNOSIS — H2513 Age-related nuclear cataract, bilateral: Secondary | ICD-10-CM | POA: Diagnosis not present

## 2022-01-16 DIAGNOSIS — H35033 Hypertensive retinopathy, bilateral: Secondary | ICD-10-CM | POA: Diagnosis not present

## 2022-01-24 IMAGING — CR DG CHEST 2V
2 series · 2 of 2 positions shown · non-contrast
Comparison: None.

CLINICAL DATA: Chest pain and palpitations.

EXAM:
CHEST - 2 VIEW

[w chest pa]
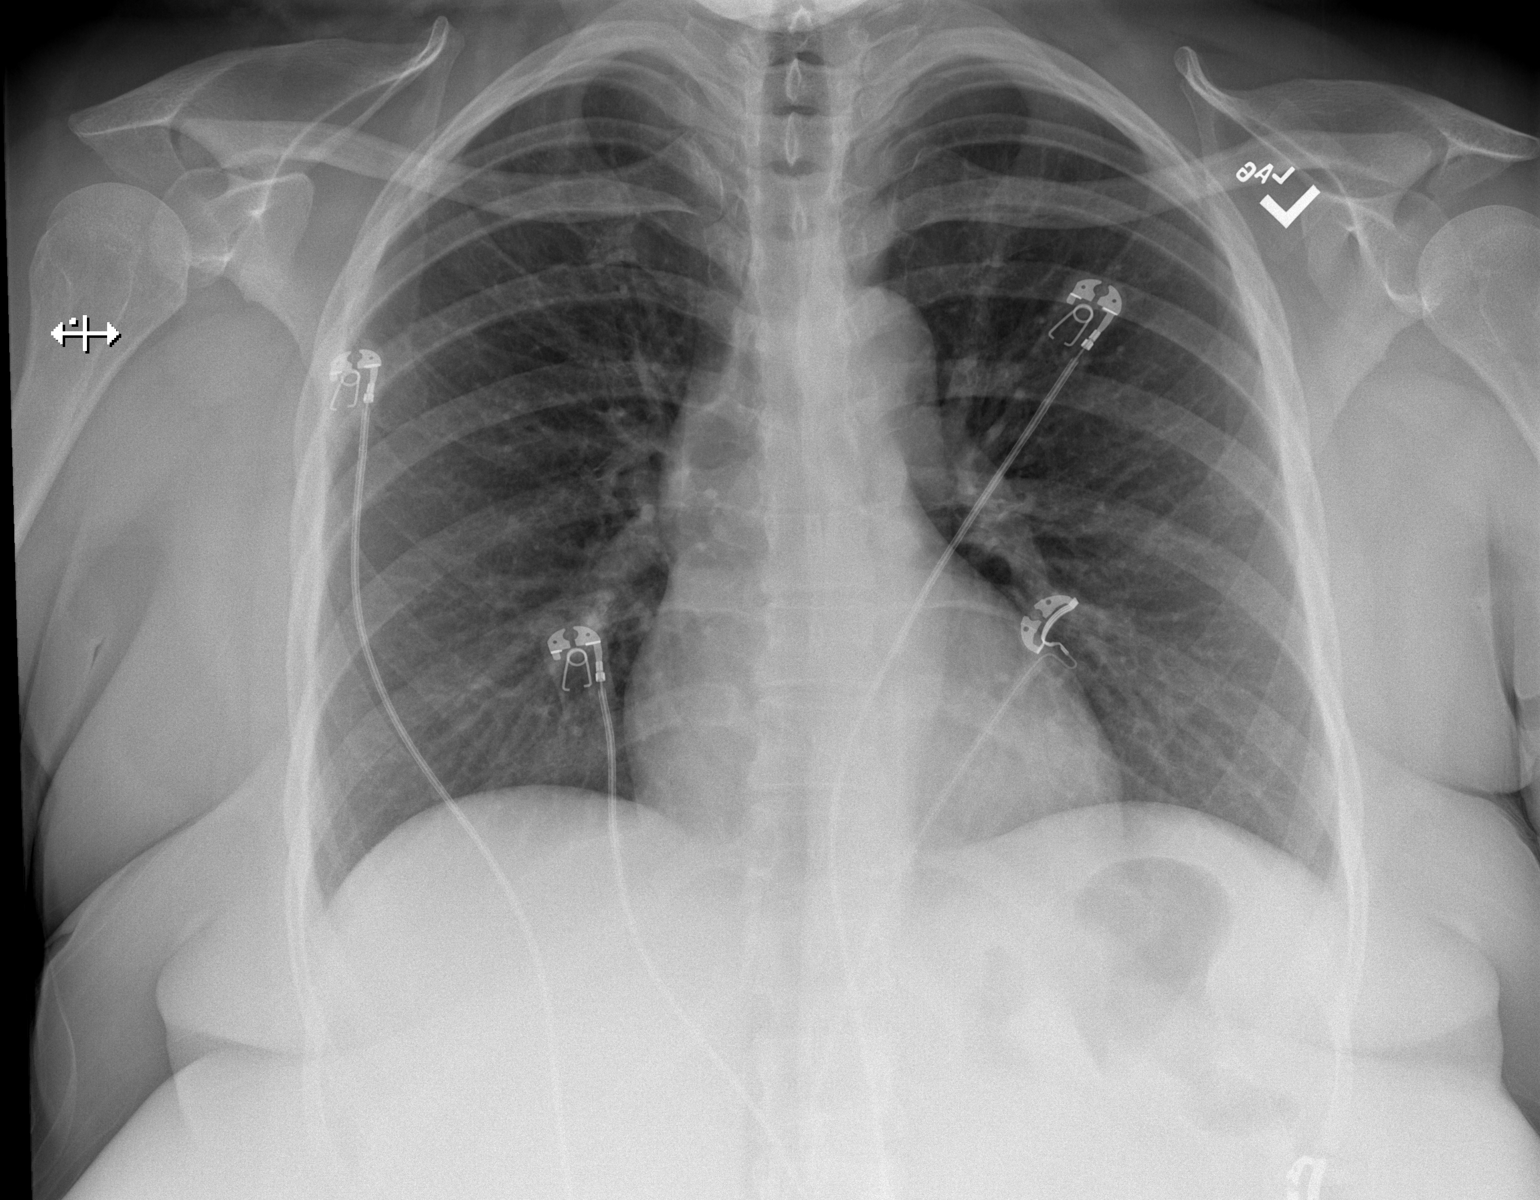

[w chest lat]
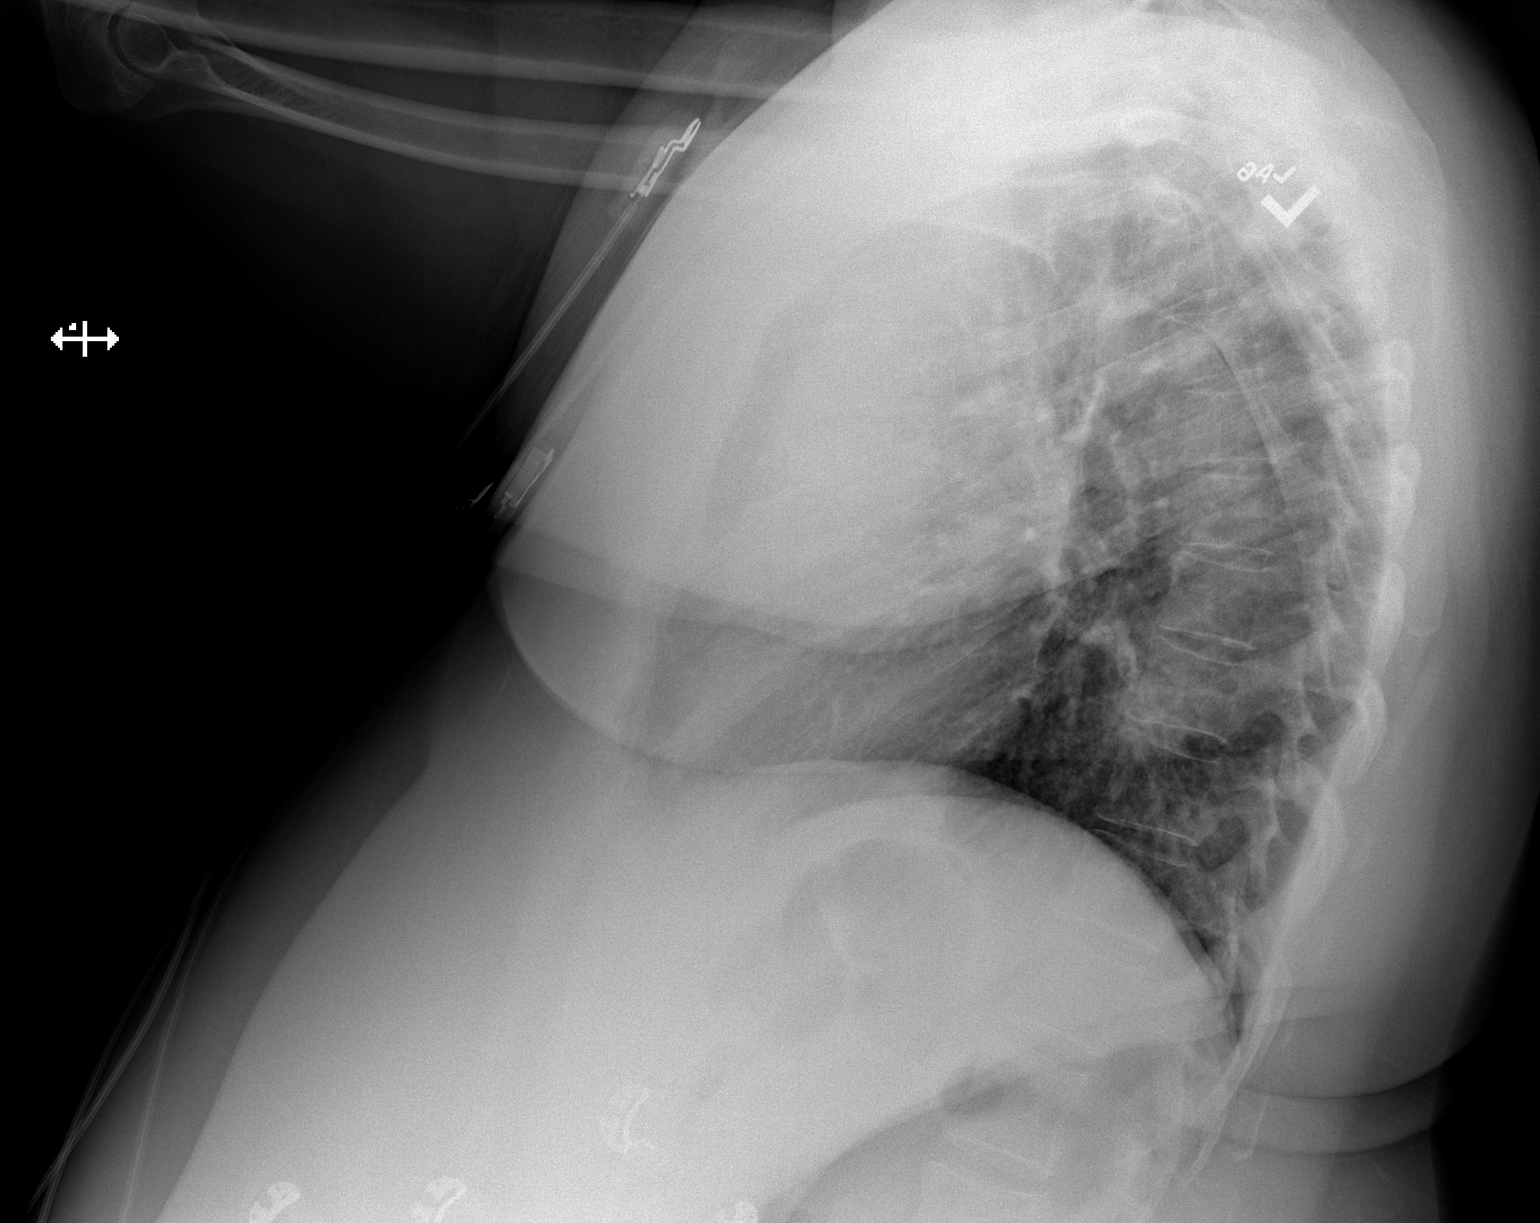

[2 of 2 positions shown; findings below may reference images not displayed]

FINDINGS: The heart size and mediastinal contours are within normal limits.
Both lungs are clear. The visualized skeletal structures are
unremarkable.
IMPRESSION: No active cardiopulmonary disease.

## 2022-02-10 DIAGNOSIS — F419 Anxiety disorder, unspecified: Secondary | ICD-10-CM | POA: Diagnosis not present

## 2022-03-03 DIAGNOSIS — F419 Anxiety disorder, unspecified: Secondary | ICD-10-CM | POA: Diagnosis not present

## 2022-03-11 DIAGNOSIS — E782 Mixed hyperlipidemia: Secondary | ICD-10-CM | POA: Diagnosis not present

## 2022-03-11 DIAGNOSIS — Z86010 Personal history of colonic polyps: Secondary | ICD-10-CM | POA: Diagnosis not present

## 2022-03-11 DIAGNOSIS — Z1211 Encounter for screening for malignant neoplasm of colon: Secondary | ICD-10-CM | POA: Diagnosis not present

## 2022-03-31 DIAGNOSIS — F419 Anxiety disorder, unspecified: Secondary | ICD-10-CM | POA: Diagnosis not present

## 2022-04-14 DIAGNOSIS — F419 Anxiety disorder, unspecified: Secondary | ICD-10-CM | POA: Diagnosis not present

## 2022-06-02 DIAGNOSIS — I10 Essential (primary) hypertension: Secondary | ICD-10-CM | POA: Diagnosis not present

## 2022-06-02 DIAGNOSIS — E782 Mixed hyperlipidemia: Secondary | ICD-10-CM | POA: Diagnosis not present

## 2022-06-02 DIAGNOSIS — E559 Vitamin D deficiency, unspecified: Secondary | ICD-10-CM | POA: Diagnosis not present

## 2022-06-02 DIAGNOSIS — R7309 Other abnormal glucose: Secondary | ICD-10-CM | POA: Diagnosis not present

## 2022-06-09 DIAGNOSIS — F419 Anxiety disorder, unspecified: Secondary | ICD-10-CM | POA: Diagnosis not present

## 2022-06-12 DIAGNOSIS — E782 Mixed hyperlipidemia: Secondary | ICD-10-CM | POA: Diagnosis not present

## 2022-06-12 DIAGNOSIS — Z6841 Body Mass Index (BMI) 40.0 and over, adult: Secondary | ICD-10-CM | POA: Diagnosis not present

## 2022-06-12 DIAGNOSIS — G4700 Insomnia, unspecified: Secondary | ICD-10-CM | POA: Diagnosis not present

## 2022-06-12 DIAGNOSIS — Z0001 Encounter for general adult medical examination with abnormal findings: Secondary | ICD-10-CM | POA: Diagnosis not present

## 2022-06-12 DIAGNOSIS — I10 Essential (primary) hypertension: Secondary | ICD-10-CM | POA: Diagnosis not present

## 2022-06-18 DIAGNOSIS — Z01411 Encounter for gynecological examination (general) (routine) with abnormal findings: Secondary | ICD-10-CM | POA: Diagnosis not present

## 2022-06-18 DIAGNOSIS — E249 Cushing's syndrome, unspecified: Secondary | ICD-10-CM | POA: Diagnosis not present

## 2022-06-23 DIAGNOSIS — F419 Anxiety disorder, unspecified: Secondary | ICD-10-CM | POA: Diagnosis not present

## 2022-07-07 DIAGNOSIS — F419 Anxiety disorder, unspecified: Secondary | ICD-10-CM | POA: Diagnosis not present

## 2022-07-28 DIAGNOSIS — F419 Anxiety disorder, unspecified: Secondary | ICD-10-CM | POA: Diagnosis not present

## 2022-08-04 DIAGNOSIS — D122 Benign neoplasm of ascending colon: Secondary | ICD-10-CM | POA: Diagnosis not present

## 2022-08-04 DIAGNOSIS — K6389 Other specified diseases of intestine: Secondary | ICD-10-CM | POA: Diagnosis not present

## 2022-08-04 DIAGNOSIS — Z1211 Encounter for screening for malignant neoplasm of colon: Secondary | ICD-10-CM | POA: Diagnosis not present

## 2022-08-04 DIAGNOSIS — K635 Polyp of colon: Secondary | ICD-10-CM | POA: Diagnosis not present

## 2022-09-08 DIAGNOSIS — F419 Anxiety disorder, unspecified: Secondary | ICD-10-CM | POA: Diagnosis not present

## 2022-09-16 DIAGNOSIS — I10 Essential (primary) hypertension: Secondary | ICD-10-CM | POA: Diagnosis not present

## 2022-09-16 DIAGNOSIS — Z6841 Body Mass Index (BMI) 40.0 and over, adult: Secondary | ICD-10-CM | POA: Diagnosis not present

## 2022-09-22 DIAGNOSIS — F419 Anxiety disorder, unspecified: Secondary | ICD-10-CM | POA: Diagnosis not present

## 2022-10-06 DIAGNOSIS — F419 Anxiety disorder, unspecified: Secondary | ICD-10-CM | POA: Diagnosis not present

## 2022-10-20 DIAGNOSIS — F419 Anxiety disorder, unspecified: Secondary | ICD-10-CM | POA: Diagnosis not present

## 2022-11-03 DIAGNOSIS — F419 Anxiety disorder, unspecified: Secondary | ICD-10-CM | POA: Diagnosis not present

## 2022-11-07 DIAGNOSIS — Z01419 Encounter for gynecological examination (general) (routine) without abnormal findings: Secondary | ICD-10-CM | POA: Diagnosis not present

## 2022-11-07 DIAGNOSIS — Z124 Encounter for screening for malignant neoplasm of cervix: Secondary | ICD-10-CM | POA: Diagnosis not present

## 2022-11-07 DIAGNOSIS — Z1211 Encounter for screening for malignant neoplasm of colon: Secondary | ICD-10-CM | POA: Diagnosis not present

## 2022-11-07 DIAGNOSIS — Z1231 Encounter for screening mammogram for malignant neoplasm of breast: Secondary | ICD-10-CM | POA: Diagnosis not present

## 2022-11-07 LAB — HM PAP SMEAR

## 2022-12-01 DIAGNOSIS — F419 Anxiety disorder, unspecified: Secondary | ICD-10-CM | POA: Diagnosis not present

## 2022-12-29 DIAGNOSIS — F419 Anxiety disorder, unspecified: Secondary | ICD-10-CM | POA: Diagnosis not present

## 2023-01-14 ENCOUNTER — Encounter: Payer: Self-pay | Admitting: Bariatrics

## 2023-01-14 ENCOUNTER — Ambulatory Visit: Payer: BC Managed Care – PPO | Admitting: Bariatrics

## 2023-01-14 VITALS — BP 165/93 | HR 66 | Temp 98.0°F | Ht 61.5 in | Wt 278.0 lb

## 2023-01-14 DIAGNOSIS — Z6841 Body Mass Index (BMI) 40.0 and over, adult: Secondary | ICD-10-CM

## 2023-01-14 DIAGNOSIS — R7303 Prediabetes: Secondary | ICD-10-CM | POA: Diagnosis not present

## 2023-01-14 DIAGNOSIS — E65 Localized adiposity: Secondary | ICD-10-CM | POA: Diagnosis not present

## 2023-01-14 DIAGNOSIS — Z0289 Encounter for other administrative examinations: Secondary | ICD-10-CM

## 2023-01-14 DIAGNOSIS — I10 Essential (primary) hypertension: Secondary | ICD-10-CM | POA: Diagnosis not present

## 2023-01-14 NOTE — Progress Notes (Signed)
Office: [phone]  /  Fax: [phone]   Initial Visit  Angela Stanley was seen in clinic today to evaluate for obesity. She is interested in losing weight to improve overall health and reduce the risk of weight related complications. She presents today to review program treatment options, initial physical assessment, and evaluation.     She was referred by: Friend or Family  When asked what else they would like to accomplish? She states: Adopt healthier eating patterns and Improve quality of life  When asked how has your weight affected you? She states: Contributed to medical problems, Having fatigue, and Having poor endurance  Some associated conditions: Hypertension, Prediabetes, and Other: CRP levels high   Contributing factors: Family history, Stress, Reduced physical activity, Life event, and Menopause  Weight promoting medications identified: None  Current nutrition plan: Low-carb and Other: Keto, and Weight Watchers  Current level of physical activity: None and Walking, Bike   Current or previous pharmacotherapy: GLP-1 (Ozempic), some fatigue.   Response to medication: Ineffective so it was discontinued   Past medical history includes:   Past Medical History:  Diagnosis Date   Hyperlipidemia    Obesity      Objective:   BP (!) 165/93   Pulse 66   Temp 98 F (36.7 C)   Ht 5' 1.5" (1.562 m)   Wt 278 lb (126.1 kg)   SpO2 98%   BMI 51.68 kg/m  She was weighed on the bioimpedance scale: Body mass index is 51.68 kg/m.  Peak Weight:278 lbs , Body Fat%: 53.7 %, Visceral Fat Rating: 21, Weight trend over the last 12 months: Increasing  General:  Alert, oriented and cooperative. Patient is in no acute distress.  Respiratory: Normal respiratory effort, no problems with respiration noted  Extremities: Normal range of motion.    Mental Status: Normal mood and affect. Normal behavior. Normal judgment and thought content.   DIAGNOSTIC DATA  REVIEWED:  BMET    Component Value Date/Time   NA 136 05/15/2020 0010   K 3.2 (L) 05/15/2020 0010   CL 102 05/15/2020 0010   CO2 25 05/15/2020 0010   GLUCOSE 124 (H) 05/15/2020 0010   BUN 10 05/15/2020 0010   CREATININE 0.63 05/15/2020 0010   CALCIUM 8.9 05/15/2020 0010   GFRNONAA >60 05/15/2020 0010   GFRAA >60 05/15/2020 0010   Lab Results  Component Value Date   HGBA1C 5.6 05/28/2020   HGBA1C 5.6 05/28/2020   HGBA1C 5.6 (A) 05/28/2020   HGBA1C 5.6 05/28/2020   No results found for: "INSULIN" CBC    Component Value Date/Time   WBC 7.1 05/15/2020 0010   RBC 4.61 05/15/2020 0010   HGB 13.3 05/15/2020 0010   HCT 40.6 05/15/2020 0010   PLT 251 05/15/2020 0010   MCV 88.1 05/15/2020 0010   MCH 28.9 05/15/2020 0010   MCHC 32.8 05/15/2020 0010   RDW 14.3 05/15/2020 0010   Iron/TIBC/Ferritin/ %Sat No results found for: "IRON", "TIBC", "FERRITIN", "IRONPCTSAT" Lipid Panel  No results found for: "CHOL", "TRIG", "HDL", "CHOLHDL", "VLDL", "LDLCALC", "LDLDIRECT" Hepatic Function Panel  No results found for: "PROT", "ALBUMIN", "AST", "ALT", "ALKPHOS", "BILITOT", "BILIDIR", "IBILI" No results found for: "TSH"   Assessment and Plan:   Visceral Obesity.   She has a visceral fat rating of 21 per the bio-impedence scale.   Plan: The goal is a visceral fat rating of 13 or below.  Will work on the plan and increase exercise/begin exercise.  but many of the same tips  can help both subcutaneous and visceral fat.  Information sheet on " Healthy and Unhealthy fats.  Will minimize all carbohydrates ( sweets and starches ).    Hypertension Hypertension control uncertain.  Medication(s): Bystolic 10 mg   BP Readings from Last 3 Encounters:  01/14/23 (!) 165/93  08/30/20 135/69  05/28/20 (!) 146/76   Lab Results  Component Value Date   CREATININE 0.63 05/15/2020   No results found for: "GFR"  Plan: Continue all antihypertensives at current dosages. No added salt. Will  keep sodium content to 1,500 mg or less per day.    Prediabetes:  She states that she has prediabetes which is a fairly new diagnosis.   Plan: Will do labs at her next visit.   Morbid Obesity: Current BMI 51    Obesity Treatment / Action Plan:  Patient will work on garnering support from family and friends to begin weight loss journey. Will work on eliminating or reducing the presence of highly palatable, calorie dense foods in the home. Will complete provided nutritional and psychosocial assessment questionnaire before the next appointment. Will be scheduled for indirect calorimetry to determine resting energy expenditure in a fasting state.  This will allow [place] to create a reduced calorie, high-protein meal plan to promote loss of fat mass while preserving muscle mass. Was counseled on nutritional approaches to weight loss and benefits of reducing processed foods and consuming plant-based foods and high quality protein as part of nutritional weight management.  Obesity Education Performed Today:  She was weighed on the bioimpedance scale and results were discussed and documented in the synopsis.  We discussed obesity as a disease and the importance of a more detailed evaluation of all the factors contributing to the disease.  We discussed the importance of long term lifestyle changes which include nutrition, exercise and behavioral modifications as well as the importance of customizing this to her specific health and social needs.  We discussed the benefits of reaching a healthier weight to alleviate the symptoms of existing conditions and reduce the risks of the biomechanical, metabolic and psychological effects of obesity.  Discussed New Patient/Late Arrival, and Cancellation Policies. Patient voiced understanding and allowed to ask questions.   Angela Stanley appears to be in the action stage of change and states they are ready to start intensive lifestyle modifications and  behavioral modifications.  30 minutes was spent today on this visit including the above counseling, pre-visit chart review, and post-visit documentation.  Reviewed by clinician on day of visit: allergies, medications, problem list, medical history, surgical history, family history, social history, and previous encounter notes.    Deryk Bozman A. Lorretta HarpO.

## 2023-01-26 DIAGNOSIS — F419 Anxiety disorder, unspecified: Secondary | ICD-10-CM | POA: Diagnosis not present

## 2023-02-03 ENCOUNTER — Ambulatory Visit: Payer: BC Managed Care – PPO | Admitting: Bariatrics

## 2023-02-03 ENCOUNTER — Encounter: Payer: Self-pay | Admitting: Bariatrics

## 2023-02-03 VITALS — BP 170/97 | HR 60 | Temp 97.9°F | Ht 61.5 in | Wt 277.0 lb

## 2023-02-03 DIAGNOSIS — I10 Essential (primary) hypertension: Secondary | ICD-10-CM

## 2023-02-03 DIAGNOSIS — Z0000 Encounter for general adult medical examination without abnormal findings: Secondary | ICD-10-CM | POA: Diagnosis not present

## 2023-02-03 DIAGNOSIS — R7303 Prediabetes: Secondary | ICD-10-CM

## 2023-02-03 DIAGNOSIS — R0602 Shortness of breath: Secondary | ICD-10-CM | POA: Diagnosis not present

## 2023-02-03 DIAGNOSIS — E559 Vitamin D deficiency, unspecified: Secondary | ICD-10-CM | POA: Diagnosis not present

## 2023-02-03 DIAGNOSIS — R5383 Other fatigue: Secondary | ICD-10-CM | POA: Diagnosis not present

## 2023-02-03 DIAGNOSIS — E65 Localized adiposity: Secondary | ICD-10-CM | POA: Diagnosis not present

## 2023-02-03 DIAGNOSIS — Z6841 Body Mass Index (BMI) 40.0 and over, adult: Secondary | ICD-10-CM

## 2023-02-03 DIAGNOSIS — F32A Depression, unspecified: Secondary | ICD-10-CM | POA: Diagnosis not present

## 2023-02-03 DIAGNOSIS — Z1331 Encounter for screening for depression: Secondary | ICD-10-CM

## 2023-02-04 LAB — HEMOGLOBIN A1C
Est. average glucose Bld gHb Est-mCnc: 128 mg/dL
Hgb A1c MFr Bld: 6.1 % — ABNORMAL HIGH (ref 4.8–5.6)

## 2023-02-04 LAB — CBC WITH DIFFERENTIAL/PLATELET
Basophils Absolute: 0 10*3/uL (ref 0.0–0.2)
Basos: 1 %
EOS (ABSOLUTE): 0.1 10*3/uL (ref 0.0–0.4)
Eos: 1 %
Hematocrit: 40.4 % (ref 34.0–46.6)
Hemoglobin: 13.3 g/dL (ref 11.1–15.9)
Immature Grans (Abs): 0 10*3/uL (ref 0.0–0.1)
Immature Granulocytes: 0 %
Lymphocytes Absolute: 1.7 10*3/uL (ref 0.7–3.1)
Lymphs: 36 %
MCH: 29 pg (ref 26.6–33.0)
MCHC: 32.9 g/dL (ref 31.5–35.7)
MCV: 88 fL (ref 79–97)
Monocytes Absolute: 0.3 10*3/uL (ref 0.1–0.9)
Monocytes: 5 %
Neutrophils Absolute: 2.8 10*3/uL (ref 1.4–7.0)
Neutrophils: 57 %
Platelets: 227 10*3/uL (ref 150–450)
RBC: 4.59 x10E6/uL (ref 3.77–5.28)
RDW: 14 % (ref 11.7–15.4)
WBC: 4.9 10*3/uL (ref 3.4–10.8)

## 2023-02-04 LAB — COMPREHENSIVE METABOLIC PANEL
ALT: 9 IU/L (ref 0–32)
AST: 14 IU/L (ref 0–40)
Albumin: 3.8 g/dL (ref 3.8–4.9)
Alkaline Phosphatase: 141 IU/L — ABNORMAL HIGH (ref 44–121)
BUN/Creatinine Ratio: 15 (ref 9–23)
BUN: 10 mg/dL (ref 6–24)
Bilirubin Total: 0.6 mg/dL (ref 0.0–1.2)
CO2: 25 mmol/L (ref 20–29)
Calcium: 9 mg/dL (ref 8.7–10.2)
Chloride: 102 mmol/L (ref 96–106)
Creatinine, Ser: 0.67 mg/dL (ref 0.57–1.00)
Globulin, Total: 2.5 g/dL (ref 1.5–4.5)
Glucose: 92 mg/dL (ref 70–99)
Potassium: 4.2 mmol/L (ref 3.5–5.2)
Sodium: 141 mmol/L (ref 134–144)
Total Protein: 6.3 g/dL (ref 6.0–8.5)
eGFR: 104 mL/min/{1.73_m2} (ref >59)

## 2023-02-04 LAB — LIPID PANEL WITH LDL/HDL RATIO
Cholesterol, Total: 257 mg/dL — ABNORMAL HIGH (ref 100–199)
HDL: 49 mg/dL (ref >39)
LDL Chol Calc (NIH): 187 mg/dL — ABNORMAL HIGH (ref 0–99)
LDL/HDL Ratio: 3.8 ratio — ABNORMAL HIGH (ref 0.0–3.2)
Triglycerides: 116 mg/dL (ref 0–149)
VLDL Cholesterol Cal: 21 mg/dL (ref 5–40)

## 2023-02-04 LAB — VITAMIN D 25 HYDROXY (VIT D DEFICIENCY, FRACTURES): Vit D, 25-Hydroxy: 29 ng/mL — ABNORMAL LOW (ref 30.0–100.0)

## 2023-02-04 LAB — TSH+T4F+T3FREE
Free T4: 1.21 ng/dL (ref 0.82–1.77)
T3, Free: 2.2 pg/mL (ref 2.0–4.4)
TSH: 0.936 u[IU]/mL (ref 0.450–4.500)

## 2023-02-04 LAB — INSULIN, RANDOM: INSULIN: 7.8 u[IU]/mL (ref 2.6–24.9)

## 2023-02-04 NOTE — Progress Notes (Signed)
Chief Complaint:   OBESITY Angela Stanley (MR# [id number]) is a 57 y.o. female who presents for evaluation and treatment of obesity and related comorbidities. Current BMI is Body mass index is 51.49 kg/m. Angela Stanley has been struggling with her weight for many years and has been unsuccessful in either losing weight, maintaining weight loss, or reaching her healthy weight goal.  Angela Stanley is currently in the action stage of change and ready to dedicate time achieving and maintaining a healthier weight. Angela Stanley is interested in becoming our patient and working on intensive lifestyle modifications including (but not limited to) diet and exercise for weight loss.  I met with the patient on 01/14/2023 for an information session.  She states that she does not like to cook, and she snacks at night.  Angela Stanley's habits were reviewed today and are as follows: she thinks her family will eat healthier with her, she struggles with family and or coworkers weight loss sabotage, her desired weight loss is 102 lbs, she has been heavy most of her life, she started gaining weight within the last 2-3 years, her heaviest weight ever was 278 pounds, she has significant food cravings issues, she snacks frequently in the evenings, she is frequently drinking liquids with calories, and she struggles with emotional eating.  Depression Screen Angela Stanley Food and Mood (modified PHQ-9) score was 16.  Subjective:   1. Other fatigue Jamise admits to daytime somnolence and admits to waking up still tired. Patient has a history of symptoms of daytime fatigue and morning fatigue. Angela Stanley generally gets 5 or 7 hours of sleep per night, and states that she has nightime awakenings. Snoring is present. Apneic episodes are present. Epworth Sleepiness Score is 11.   2. SOB (shortness of breath) on exertion Garnita notes increasing shortness of breath with exercising and seems to be worsening over time with weight gain. She notes getting out  of breath sooner with activity than she used to. This has not gotten worse recently. Angela Stanley denies shortness of breath at rest or orthopnea.  3. Visceral obesity Patient's visceral fat rating is 21.  4. Prediabetes Patient is not on medications currently.  5. Essential hypertension Patient's blood pressure is elevated at 170/79.  She is on Bystolic, but she did not take her blood pressure medications last night.  6. Health care maintenance Given obesity.   7. Vitamin D deficiency Patient had a prior diagnosis of vitamin D deficiency.  Assessment/Plan:   1. Other fatigue Angela Stanley does feel that her weight is causing her energy to be lower than it should be. Fatigue may be related to obesity, depression or many other causes. Labs will be ordered, and in the meanwhile, Angela Stanley will focus on self care including making healthy food choices, increasing physical activity and focusing on stress reduction.  - EKG 12-Lead - TSH+T4F+T3Free - CBC with Differential/Platelet  2. SOB (shortness of breath) on exertion Angela Stanley does feel that she gets out of breath more easily that she used to when she exercises. Angela Stanley shortness of breath appears to be obesity related and exercise induced. She has agreed to work on weight loss and gradually increase exercise to treat her exercise induced shortness of breath. Will continue to monitor closely.  3. Visceral obesity Patient will work on her meal plan and exercise.  Her goal visceral fat rating is 13 or below.  4. Prediabetes We will check labs today, we will follow-up at her next visit.  - Insulin, random - Hemoglobin A1c -  Comprehensive metabolic panel  5. Essential hypertension We will check labs today.  Patient is to check her blood pressure at home.  - Comprehensive metabolic panel  6. Health care maintenance We will check labs today. EKG and IC were done today, and results were reviewed with the patient.   - Lipid Panel With LDL/HDL Ratio -  Insulin, random - Hemoglobin A1c - TSH+T4F+T3Free - VITAMIN D 25 Hydroxy (Vit-D Deficiency, Fractures) - Comprehensive metabolic panel - CBC with Differential/Platelet  7. Vitamin D deficiency We will check labs today, and we will follow-up at her next visit.   - VITAMIN D 25 Hydroxy (Vit-D Deficiency, Fractures)  8. Depression screening Angela Stanley had a positive depression screening. Depression is commonly associated with obesity and often results in emotional eating behaviors. We will monitor this closely and work on CBT to help improve the non-hunger eating patterns. Referral to Psychology may be required if no improvement is seen as she continues in our clinic.  9. Morbid obesity (HCC)  10. BMI 50.0-59.9, adult (HCC) Angela Stanley is currently in the action stage of change and her goal is to continue with weight loss efforts. I recommend Angela Stanley begin the structured treatment plan as follows:  She has agreed to the Category 2 Plan and keeping a food journal and adhering to recommended goals of 1200 calories and 80 grams of protein.  Meal planning and intentional eating were discussed.  Exercise goals: No exercise has been prescribed at this time.   Behavioral modification strategies: increasing lean protein intake, decreasing simple carbohydrates, increasing vegetables, increasing water intake, decreasing eating out, no skipping meals, meal planning and cooking strategies, keeping healthy foods in the home, and planning for success.  She was informed of the importance of frequent follow-up visits to maximize her success with intensive lifestyle modifications for her multiple health conditions. She was informed we would discuss her lab results at her next visit unless there is a critical issue that needs to be addressed sooner. Angela Stanley agreed to keep her next visit at the agreed upon time to discuss these results.  Objective:   Blood pressure (!) 170/97, pulse 60, temperature 97.9 F (36.6 C),  height 5' 1.5" (1.562 m), weight 277 lb (125.6 kg), SpO2 100 %. Body mass index is 51.49 kg/m.  EKG: Normal sinus rhythm, rate 66 BPM.  Indirect Calorimeter completed today shows a VO2 of 222 and a REE of 1526.  Her calculated basal metabolic rate is 1324 thus her basal metabolic rate is worse than expected.  General: Cooperative, alert, well developed, in no acute distress. HEENT: Conjunctivae and lids unremarkable. Cardiovascular: Regular rhythm.  Lungs: Normal work of breathing. Neurologic: No focal deficits.   Lab Results  Component Value Date   CREATININE 0.67 02/03/2023   BUN 10 02/03/2023   NA 141 02/03/2023   K 4.2 02/03/2023   CL 102 02/03/2023   CO2 25 02/03/2023   Lab Results  Component Value Date   ALT 9 02/03/2023   AST 14 02/03/2023   ALKPHOS 141 (H) 02/03/2023   BILITOT 0.6 02/03/2023   Lab Results  Component Value Date   HGBA1C 6.1 (H) 02/03/2023   HGBA1C 5.6 05/28/2020   HGBA1C 5.6 05/28/2020   HGBA1C 5.6 (A) 05/28/2020   HGBA1C 5.6 05/28/2020   Lab Results  Component Value Date   INSULIN 7.8 02/03/2023   Lab Results  Component Value Date   TSH 0.936 02/03/2023   Lab Results  Component Value Date   CHOL 257 (H)  02/03/2023   HDL 49 02/03/2023   LDLCALC 187 (H) 02/03/2023   TRIG 116 02/03/2023   Lab Results  Component Value Date   WBC 4.9 02/03/2023   HGB 13.3 02/03/2023   HCT 40.4 02/03/2023   MCV 88 02/03/2023   PLT 227 02/03/2023   No results found for: "IRON", "TIBC", "FERRITIN"  Attestation Statements:   Reviewed by clinician on day of visit: allergies, medications, problem list, medical history, surgical history, family history, social history, and previous encounter notes.   Trude Mcburney, am acting as Energy manager for [employer], DO.  I have reviewed the above documentation for accuracy and completeness, and I agree with the above. Corinna Capra, DO

## 2023-02-05 ENCOUNTER — Encounter: Payer: Self-pay | Admitting: Bariatrics

## 2023-02-17 ENCOUNTER — Ambulatory Visit: Payer: BC Managed Care – PPO | Admitting: Bariatrics

## 2023-03-02 ENCOUNTER — Encounter: Payer: Self-pay | Admitting: Bariatrics

## 2023-03-02 ENCOUNTER — Ambulatory Visit: Payer: BC Managed Care – PPO | Admitting: Bariatrics

## 2023-03-02 VITALS — BP 184/82 | HR 66 | Temp 97.5°F | Ht 61.5 in | Wt 276.0 lb

## 2023-03-02 DIAGNOSIS — I10 Essential (primary) hypertension: Secondary | ICD-10-CM

## 2023-03-02 DIAGNOSIS — E559 Vitamin D deficiency, unspecified: Secondary | ICD-10-CM

## 2023-03-02 DIAGNOSIS — R7303 Prediabetes: Secondary | ICD-10-CM

## 2023-03-02 DIAGNOSIS — Z6841 Body Mass Index (BMI) 40.0 and over, adult: Secondary | ICD-10-CM

## 2023-03-02 DIAGNOSIS — E7800 Pure hypercholesterolemia, unspecified: Secondary | ICD-10-CM

## 2023-03-02 MED ORDER — CHLORTHALIDONE 25 MG PO TABS: 25.0000 mg | ORAL_TABLET | Freq: Every day | ORAL | 0 refills | Status: DC

## 2023-03-02 MED ORDER — VITAMIN D (ERGOCALCIFEROL) 1.25 MG (50000 UNIT) PO CAPS: 50000.0000 [IU] | ORAL_CAPSULE | ORAL | 0 refills | Status: DC

## 2023-03-03 ENCOUNTER — Encounter: Payer: Self-pay | Admitting: Bariatrics

## 2023-03-03 NOTE — Progress Notes (Signed)
Chief Complaint:   OBESITY Angela Stanley is here to discuss her progress with her obesity treatment plan along with follow-up of her obesity related diagnoses. Angela Stanley is on the Category 2 Plan and keeping a food journal and adhering to recommended goals of 1200 calories and 80 grams of protein and states she is following her eating plan approximately 1% of the time. Angela Stanley states she is riding the stationary bike and lifting weights for 45 minutes 4 times per week.  Today's visit was #: 2 Starting weight: 277 lbs Starting date: 02/03/2023 Today's weight: 276 lbs Today's date: 03/02/2023 Total lbs lost to date: 1 Total lbs lost since last in-office visit: 1  Interim History: Patient is down 1 pound.  She is wondering about her labs.  She is concerned about her blood pressure.  Subjective:   1. Elevated cholesterol Patient is not on medications currently.  Her recent total cholesterol was 257, LDL 187, LDL/HDL 3.8.  2. Vitamin D deficiency Patient took high-dose vitamin D.  Her recent vitamin D level was 29.0.  3. Prediabetes Patient is not on medications currently.  Her recent A1c level was 6.1.  4. Essential hypertension Patient's blood pressure is currently elevated.  Assessment/Plan:   1. Elevated cholesterol Patient is to work on eliminating trans fats and keep saturated fats low.  2. Vitamin D deficiency Patient will begin prescription vitamin D 50,000 IU once weekly with no refills.  - Vitamin D, Ergocalciferol, (DRISDOL) 1.25 MG (50000 UNIT) CAPS capsule; Take 1 capsule (50,000 Units total) by mouth every 7 (seven) days.  Dispense: 5 capsule; Refill: 0  3. Prediabetes Handout on insulin resistance and prediabetes, as well as metformin was given to the patient today.  4. Essential hypertension Patient agreed to add chlorthalidone 25 mg once daily with no refills.  We will follow-up at her next visit.  - chlorthalidone (HYGROTON) 25 MG tablet; Take 1 tablet (25 mg total)  by mouth daily.  Dispense: 30 tablet; Refill: 0  5. Morbid obesity (HCC)  6. BMI 50.0-59.9, adult (HCC) Angela Stanley is currently in the action stage of change. As such, her goal is to continue with weight loss efforts. She has agreed to the Category 2 Plan and keeping a food journal and adhering to recommended goals of 1200 calories and 80 grams of protein.   Reviewed labs with the patient from 02/03/2023, CMP, lipids, vitamin D, CBC, glucose, A1c, and insulin.  Patient will start journaling.  Increase water, fiber, and turmeric.  Exercise goals: As is, increase.   Behavioral modification strategies: increasing lean protein intake, decreasing simple carbohydrates, increasing vegetables, increasing water intake, decreasing eating out, no skipping meals, meal planning and cooking strategies, keeping healthy foods in the home, and planning for success.  Zollie has agreed to follow-up with our clinic in 2 weeks. She was informed of the importance of frequent follow-up visits to maximize her success with intensive lifestyle modifications for her multiple health conditions.   Objective:   Blood pressure (!) 184/82, pulse 66, temperature (!) 97.5 F (36.4 C), height 5' 1.5" (1.562 m), weight 276 lb (125.2 kg), SpO2 99%. Body mass index is 51.31 kg/m.  General: Cooperative, alert, well developed, in no acute distress. HEENT: Conjunctivae and lids unremarkable. Cardiovascular: Regular rhythm.  Lungs: Normal work of breathing. Neurologic: No focal deficits.   Lab Results  Component Value Date   CREATININE 0.67 02/03/2023   BUN 10 02/03/2023   NA 141 02/03/2023   K 4.2 02/03/2023  CL 102 02/03/2023   CO2 25 02/03/2023   Lab Results  Component Value Date   ALT 9 02/03/2023   AST 14 02/03/2023   ALKPHOS 141 (H) 02/03/2023   BILITOT 0.6 02/03/2023   Lab Results  Component Value Date   HGBA1C 6.1 (H) 02/03/2023   HGBA1C 5.6 05/28/2020   HGBA1C 5.6 05/28/2020   HGBA1C 5.6 (A) 05/28/2020    HGBA1C 5.6 05/28/2020   Lab Results  Component Value Date   INSULIN 7.8 02/03/2023   Lab Results  Component Value Date   TSH 0.936 02/03/2023   Lab Results  Component Value Date   CHOL 257 (H) 02/03/2023   HDL 49 02/03/2023   LDLCALC 187 (H) 02/03/2023   TRIG 116 02/03/2023   Lab Results  Component Value Date   VD25OH 29.0 (L) 02/03/2023   Lab Results  Component Value Date   WBC 4.9 02/03/2023   HGB 13.3 02/03/2023   HCT 40.4 02/03/2023   MCV 88 02/03/2023   PLT 227 02/03/2023   No results found for: "IRON", "TIBC", "FERRITIN"  Attestation Statements:   Reviewed by clinician on day of visit: allergies, medications, problem list, medical history, surgical history, family history, social history, and previous encounter notes.   Trude Mcburney, am acting as Energy manager for [employer], DO.  I have reviewed the above documentation for accuracy and completeness, and I agree with the above. Corinna Capra, DO

## 2023-03-18 DIAGNOSIS — F419 Anxiety disorder, unspecified: Secondary | ICD-10-CM | POA: Diagnosis not present

## 2023-03-19 ENCOUNTER — Ambulatory Visit: Payer: BC Managed Care – PPO | Admitting: Bariatrics

## 2023-03-19 ENCOUNTER — Encounter: Payer: Self-pay | Admitting: Bariatrics

## 2023-03-19 DIAGNOSIS — E559 Vitamin D deficiency, unspecified: Secondary | ICD-10-CM

## 2023-03-19 DIAGNOSIS — Z6841 Body Mass Index (BMI) 40.0 and over, adult: Secondary | ICD-10-CM

## 2023-03-19 DIAGNOSIS — I10 Essential (primary) hypertension: Secondary | ICD-10-CM | POA: Diagnosis not present

## 2023-03-19 DIAGNOSIS — F5089 Other specified eating disorder: Secondary | ICD-10-CM

## 2023-03-19 MED ORDER — CHLORTHALIDONE 25 MG PO TABS: 25.0000 mg | ORAL_TABLET | Freq: Every day | ORAL | 0 refills | Status: DC

## 2023-03-19 MED ORDER — VITAMIN D (ERGOCALCIFEROL) 1.25 MG (50000 UNIT) PO CAPS
50000.0000 [IU] | ORAL_CAPSULE | ORAL | 0 refills | Status: DC
Start: 2023-03-19 — End: 2023-06-23

## 2023-03-19 NOTE — Progress Notes (Signed)
WEIGHT SUMMARY AND BIOMETRICS  Weight Lost Since Last Visit: 1lb  Vitals Temp: 98.3 F (36.8 C) BP: 130/83 Pulse Rate: 70 SpO2: 100 %   Anthropometric Measurements Height: 5' 1.5" (1.562 m) Weight: 275 lb (124.7 kg) BMI (Calculated): 51.13 Weight at Last Visit: 276lb Weight Lost Since Last Visit: 1lb Starting Weight: 277lb Total Weight Loss (lbs): 2 lb (0.907 kg)   Body Composition  Body Fat %: 55 % Fat Mass (lbs): 151.4 lbs Muscle Mass (lbs): 117.8 lbs Total Body Water (lbs): 89.6 lbs Visceral Fat Rating : 21   Other Clinical Data Fasting: yes Labs: no Today's Visit #: 3 Starting Date: 02/03/23    OBESITY Ahlani is here to discuss her progress with her obesity treatment plan along with follow-up of her obesity related diagnoses.     Nutrition Plan: the Category 2 plan - 70% adherence.  Current exercise: cardiovascular workout on exercise equipment and resistance training.   Interim History:  She is down 1 lb since her last visit.  Eating all of the food on the plan., Is not skipping meals, Meeting protein goals., and Water intake is inadequate.  Hunger is moderately controlled.  Cravings are moderately controlled.  Assessment/Plan:   1. Vitamin D deficiency Vitamin D Deficiency Vitamin D is not at goal of 50.  Most recent vitamin D level was 29. She is on  prescription ergocalciferol 50,000 IU weekly. Lab Results  Component Value Date   VD25OH 29.0 (L) 02/03/2023    Plan: Refill prescription vitamin D 50,000 IU weekly.   2. Essential hypertension  She started a medication for her blood pressure at her last visit. She denies side effects.  Hypertension improved.  Medication(s): Chlorthalidone 25 mg  BP Readings from Last 3 Encounters:  03/19/23 130/83  03/02/23 (!) 184/82  02/03/23 (!) 170/97   Lab Results  Component Value Date   CREATININE 0.67 02/03/2023   CREATININE 0.63 05/15/2020    Plan: Continue all  antihypertensives at current dosages.  No added salt. Will keep sodium content to 1,500 mg or less per day.   She will follow the plan closely.  She will increase her water.   Eating disorder/emotional eating Hermione has had issues with nighttime eating and boredom eating. Currently this is poorly controlled. Overall mood is stable. Denies suicidal/homicidal ideation. Medication(s): none at this. Her insurance is not covering any Glp-1's. She is not a candidate for Qsymia or phentermine secondary to HTN. I discussed Topamax and Wellbutrin and she will consider and discuss at the next visit. She denies contraindications to these medications.   Plan:  Specifically regarding patient's less desirable eating habits and patterns, we employed the technique of small changes when she cannot fully commit to her prudent nutritional plan. Discussed distractions to curb eating behaviors. Discussed activities to do with one's hands in the evening  Be sure to get adequate rest as lack of rest can trigger appetite.  Have plan in place for stressful events.  Consider other rewards besides food.     Morbid Obesity: Current BMI BMI (Calculated): 51.13  She will adhere to the plan more closely.   Tacy is currently in the action stage of change. As such, her goal is to continue with weight loss efforts.  She has agreed to the Category 2 plan and keeping a food journal with goal of 1,200 to 1,300 calories and 90 to 100 grams of protein daily.  Exercise goals: All adults should avoid inactivity. Some physical activity is better  than none, and adults who participate in any amount of physical activity gain some health benefits.  Behavioral modification strategies: increasing lean protein intake, no meal skipping, meal planning , increase water intake, better snacking choices, and planning for success.  Avigayil has agreed to follow-up with our clinic in 3 weeks.      Objective:   VITALS: Per patient if  applicable, see vitals. GENERAL: Alert and in no acute distress. CARDIOPULMONARY: No increased WOB. Speaking in clear sentences.  PSYCH: Pleasant and cooperative. Speech normal rate and rhythm. Affect is appropriate. Insight and judgement are appropriate. Attention is focused, linear, and appropriate.  NEURO: Oriented as arrived to appointment on time with no prompting.   Attestation Statements:   This was prepared with the assistance of Engineer, civil (consulting).  Occasional wrong-word or sound-a-like substitutions may have occurred due to the inherent limitations of voice recognition software.   Corinna Capra, DO

## 2023-04-06 DIAGNOSIS — F419 Anxiety disorder, unspecified: Secondary | ICD-10-CM | POA: Diagnosis not present

## 2023-04-07 ENCOUNTER — Other Ambulatory Visit: Payer: Self-pay | Admitting: Nurse Practitioner

## 2023-04-07 ENCOUNTER — Ambulatory Visit: Payer: BC Managed Care – PPO | Admitting: Nurse Practitioner

## 2023-04-07 ENCOUNTER — Telehealth: Payer: Self-pay

## 2023-04-07 ENCOUNTER — Encounter: Payer: Self-pay | Admitting: Nurse Practitioner

## 2023-04-07 VITALS — BP 138/84 | HR 69 | Temp 98.3°F | Ht 61.5 in | Wt 279.0 lb

## 2023-04-07 DIAGNOSIS — I10 Essential (primary) hypertension: Secondary | ICD-10-CM

## 2023-04-07 DIAGNOSIS — Z6841 Body Mass Index (BMI) 40.0 and over, adult: Secondary | ICD-10-CM

## 2023-04-07 MED ORDER — WEGOVY 0.25 MG/0.5ML ~~LOC~~ SOAJ: 0.2500 mg | SUBCUTANEOUS | 0 refills | Status: DC

## 2023-04-07 NOTE — Patient Instructions (Signed)
What is a GLP-1 Glucagon like peptide-1 (GLP-1) agonists represent a class of medications used to treat type 2 diabetes mellitus and obesity.  GLP-1 medications mimic the action of a hormone called glucagon like peptide 1.  When blood sugar levels start to rise/increase these drugs stimulate the body to produce more insulin.  When that happens, the extra insulin helps to lower the blood sugar levels in the body.  This in returns helps with decreasing cravings.  These medications also slow the movement of food from the stomach into the small intestine.  This in return helps one to full faster and longer.    Diabetic medications: Approved for treatment of diabetes mellitus but does not have full approval for weight loss use Victoza (liraglutide) Ozempic (semaglutide) Mounjaro Trulicity Rybelsus  Weight loss medications: Approved for long-term weight loss use.        Saxenda (liraglutide) Wegovy (semaglutide) Zepbound  Contraindications:  Pancreatitis (active gallstones) Medullary thyroid cancer High triglycerides (>500)-will need labs prior to starting Multiple Endocrine Neoplasia syndrome type 2 (MEN 2) Trying to get pregnant Breastfeeding Use with caution with taking insulin or sulfonylureas (will need to monitor blood sugars for hypoglycemia) Side effects (most common): Most common side effects are nausea, gas, bloating and constipation.  Other possible side effects are headaches, belching, diarrhea, tiredness (fatigue), vomiting, upset stomach, dizziness, heartburn and stomach (abdominal pain).  If you think that you are becoming dehydrated, please inform our office or your primary family provider.  Stop immediately and go to ER if you have any symptoms of a serious allergic reaction including swelling of your face, lips, tongue or throat; problems breathing or swallowing; severe rash or itching; fainting or feeling dizzy; or very rapid heart rate.        Steps to starting your  WegovyT  The office staff will send a prior authorization request to your insurance company for approval. We will send you a mychart message once we hear back from your insurance with a decision.  This can take up to 7-10 business days.   Once your WegovyTis approved, you may then pick up Wegovy pen from your pharmacy.    Learn how to do Wegovy injections on the Wegovy.com website. There is a training video that will walk you through how to safely perform the injection. If you have questions for our clinical staff, please contact our  clinical staff. If you have any symptoms of allergic reaction to WegovyT discontinue immediately and call 911.  1. What should I tell my provider before using WegovyT ? have or have had problems with your pancreas or kidneys. have type 2 diabetes and a history of diabetic retinopathy. have or have had depression, suicidal thoughts, or mental health issues. are pregnant or plan to become pregnant. WegovyT may harm your unborn baby. You should stop using WegovyT 3 months before you plan to become pregnant or if you are breastfeeding or plan to breastfeed. It is not known if WegovyT passes into your breast milk.  2. What is WegovyT and how does it work?  WegovyT is an injectable prescription medication prescribed by your provider to help with your weight loss.  This medicine will be most effective when combined with a reduced calorie diet and physical activity.  WegovyT is not for the treatment of type 2 diabetes mellitus. WegovyT should not be used with other GLP-1 receptor agonist medicines. The addition of WegovyT in  patients treated with insulin has not been evaluated. When initiating WegovyT, consider   reducing the dose of concomitantly administered insulin secretagogues (such as sulfonylureas) or insulin to reduce the risk of  hypoglycemia.  One role of GLP-1 is to send a signal to your brain to tell it you are full. It also slows down stomach emptying which  will make you feel full longer and may help with reducing cravings.   3.  How should I take WegovyT?  Administer WegovyT once weekly, on the same day each week, at any time of day, with or without meals Inject subcutaneously in the abdomen, thigh or upper arm Initiate at 0.25 mg once weekly for 4 weeks. In 4 week intervals, increase the dose until a dose of 2.4 mg is reached (we will discuss with you the dosage at each visit). The maintenance dose of WegovyT is 2.4 mg once weekly.  The dosing schedule of Wegovy is:  0.25 mg per week X 4 weeks 0.5 mg per week X 4 weeks 1.0 mg per week X 4 weeks 1.7 mg per week X 4 weeks 2.4 mg per week   Missed dose   If you miss your injection day, go ahead inject your current dose. You can go >7 days, but not <7 days between injections. You may change your injection day (It must be >7 days). If you miss >2 doses, you can still keep next injection dose the same or follow de-escalation schedule which may minimize GI symptoms.   In patients with type 2 diabetes, monitor blood glucose prior to starting and during WEGOVYT treatment.   Inject your dose of Wegovy under the skin (subcutaneous injection) in your stomach area (abdomen), upper leg (thigh) or upper arm. Do not inject into a vein or a muscle. The injection site should be rotated and not given in the same spot each day. Hold the needle under the skin and count to "10". This will allow all of the medicine to be dispensed under the skin. Always wipe your skin with an alcohol prep pad before injection  Dispose of used pen in an approved sharps container. More practical options that can be put in the trash  to go to the landfill are milk jugs or plastic laundry detergent containers with a screw on lid.  What side effects may I notice from taking WegovyT?  Side effects that usually do not require medical attention (report to our office if they continue or are bothersome): Nausea (most common but  decreases over time in most people as their body gets used to the medicine) Diarrhea Constipation (you may take an over the counter laxative if needed) Headache Decreased appetite Upset stomach Tiredness Dizziness Feeling bloated Hair loss Belching Gas Heartburn  Side effects that you should call 911 as soon as possible Vomiting Stomach pain Fever Yellowing of your skin or eyes  Clay-colored stools Increased heart rate while at rest Low blood sugar  Sudden changes in mood, behaviors, thoughts, feelings, or thoughts of suicide If you get a lump or swelling in your neck, hoarseness, trouble swallowing, or shortness of breath. Allergic reaction such as skin rash, itching, hives, swelling of the face, tongue, or lips  Helpful tips for managing nausea Nausea is a common side effect when first starting WegovyT. If you experience nausea, be sure to connect with your health care provider. He or she will offer guidance on ways to manage it, which may include: Eat bland, low-fat foods, like crackers, toast and rice  Eat foods that contain water, like soups and gelatin  Avoid lying   down after you eat  Go outdoors for fresh air  Eat more slowly    Other important information Do not drop your pen or knock it against hard surfaces  Do not expose your pen to any liquids  If you think that your pen may be damaged, do not try to fix it. Use a new one Keep the pen cap on until you are ready to inject. Your pen will no longer be sterile if you store an unused pen without the cap, if you pull the pen cap off and put it on again, or if the pen cap is missing. This could lead to an infection  Store the WegovyT pen in the refrigerator from 36F to 46F (2C to 8C) If needed, before removing the pen cap, WegovyT can be stored from 8C to 30C (46F to 86F) in the original carton for up to 28 days.  Keep WegovyT in the original carton to protect it from light  Do not freeze  Throw away pen if  WegovyT has been frozen, has been exposed to light or temperatures above 86F (30C), or has been out of the refrigerator for 28 days or longer It's important to properly dispose of your used WegovyT pens. Do not throw the pen away in your household trash. Instead, use an FDA-cleared sharps disposable container or a sturdy household container with a tight-fitting lid, like a heavy duty plastic container.   Wegovy pen training website: https://www.wegovy.com/about-wegovy/how-to-use-the-wegovy-pen.html  Wegovy savings and support link: https://www.wegovy.com/saving-and-support/save-and-support.html                                                                                        

## 2023-04-07 NOTE — Telephone Encounter (Signed)
PA submitted through Cover My Meds for [facility]. Awaiting insurance determination. Key: [id number]

## 2023-04-07 NOTE — Telephone Encounter (Signed)
Received through Cover My Meds: Message from plan: Denied. This health benefit plan does not cover the following services, supplies, drugs or charges: Any treatment or regimen, medical or surgical, for the purpose of reducing or controlling the weight of the member, or for the treatment of obesity, except for surgical treatment of morbid obesity, or as specifically covered by this health benefit plan.

## 2023-04-07 NOTE — Telephone Encounter (Signed)
Mychart message sent to patient.

## 2023-04-07 NOTE — Progress Notes (Signed)
Office: [phone]  /  Fax: [phone]  WEIGHT SUMMARY AND BIOMETRICS  Weight Lost Since Last Visit: 0lb  Weight Gained Since Last Visit: 4lb   Vitals Temp: 98.3 F (36.8 C) BP: 138/84 Pulse Rate: 69 SpO2: 98 %   Anthropometric Measurements Height: 5' 1.5" (1.562 m) Weight: 279 lb (126.6 kg) BMI (Calculated): 51.87 Weight at Last Visit: 275lb Weight Lost Since Last Visit: 0lb Weight Gained Since Last Visit: 4lb Starting Weight: 277lb Total Weight Loss (lbs): 0 lb (0 kg)   Body Composition  Body Fat %: 56.6 % Fat Mass (lbs): 158 lbs Muscle Mass (lbs): 114.8 lbs Total Body Water (lbs): 91.6 lbs Visceral Fat Rating : 22   Other Clinical Data Fasting: Yes Labs: No Today's Visit #: 4 Starting Date: 02/03/23     HPI  Chief Complaint: OBESITY  Angela Stanley is here to discuss her progress with her obesity treatment plan. She is on the the Category 2 Plan and states she is following her eating plan approximately 50 % of the time. She states she is exercising 35-45 minutes 4 days per week.   Interval History:  Since last office visit she has gained 4 pounds.  She is frustrated with weight gain and feels that she is following the plan well.  She is eating smaller portions.  She is using the loseitapp. She is averaging around around 1400 calories, 100+ grams of protein, 243 carbs and 19 fats. She is drinking tru sparkling water, 3 cups of water, decaf coffee 2 times per week with 1 cream and poppy sparkling water.  She is struggling with polyphagia and cravings especially after lunch and dinner.   Her mother lives with her and cooks for the family.     Pharmacotherapy for weight loss: She is not currently taking medications  for medical weight loss.    Previous pharmacotherapy for medical weight loss:  GLP-1 (Ozempic), some fatigue.   Bariatric surgery:  Patient has not had bariatric surgery.  Hypertension Hypertension looks better.  Medication(s): Bystolic  10mg .  Denies side effects.  Denies chest pain, palpitations and SOB.  BP Readings from Last 3 Encounters:  04/07/23 138/84  03/19/23 130/83  03/02/23 (!) 184/82   Lab Results  Component Value Date   CREATININE 0.67 02/03/2023   CREATININE 0.63 05/15/2020     PHYSICAL EXAM:  Blood pressure 138/84, pulse 69, temperature 98.3 F (36.8 C), height 5' 1.5" (1.562 m), weight 279 lb (126.6 kg), SpO2 98%. Body mass index is 51.86 kg/m.  General: She is overweight, cooperative, alert, well developed, and in no acute distress. PSYCH: Has normal mood, affect and thought process.   Extremities: No edema.  Neurologic: No gross sensory or motor deficits. No tremors or fasciculations noted.    DIAGNOSTIC DATA REVIEWED:  BMET    Component Value Date/Time   NA 141 02/03/2023 1121   K 4.2 02/03/2023 1121   CL 102 02/03/2023 1121   CO2 25 02/03/2023 1121   GLUCOSE 92 02/03/2023 1121   GLUCOSE 124 (H) 05/15/2020 0010   BUN 10 02/03/2023 1121   CREATININE 0.67 02/03/2023 1121   CALCIUM 9.0 02/03/2023 1121   GFRNONAA >60 05/15/2020 0010   GFRAA >60 05/15/2020 0010   Lab Results  Component Value Date   HGBA1C 6.1 (H) 02/03/2023   HGBA1C 5.6 (A) 05/28/2020   Lab Results  Component Value Date   INSULIN 7.8 02/03/2023   Lab Results  Component Value Date   TSH 0.936 02/03/2023  CBC    Component Value Date/Time   WBC 4.9 02/03/2023 1121   WBC 7.1 05/15/2020 0010   RBC 4.59 02/03/2023 1121   RBC 4.61 05/15/2020 0010   HGB 13.3 02/03/2023 1121   HCT 40.4 02/03/2023 1121   PLT 227 02/03/2023 1121   MCV 88 02/03/2023 1121   MCH 29.0 02/03/2023 1121   MCH 28.9 05/15/2020 0010   MCHC 32.9 02/03/2023 1121   MCHC 32.8 05/15/2020 0010   RDW 14.0 02/03/2023 1121   Iron Studies No results found for: "IRON", "TIBC", "FERRITIN", "IRONPCTSAT" Lipid Panel     Component Value Date/Time   CHOL 257 (H) 02/03/2023 1121   TRIG 116 02/03/2023 1121   HDL 49 02/03/2023 1121   LDLCALC  187 (H) 02/03/2023 1121   Hepatic Function Panel     Component Value Date/Time   PROT 6.3 02/03/2023 1121   ALBUMIN 3.8 02/03/2023 1121   AST 14 02/03/2023 1121   ALT 9 02/03/2023 1121   ALKPHOS 141 (H) 02/03/2023 1121   BILITOT 0.6 02/03/2023 1121      Component Value Date/Time   TSH 0.936 02/03/2023 1121   Nutritional Lab Results  Component Value Date   VD25OH 29.0 (L) 02/03/2023     ASSESSMENT AND PLAN  TREATMENT PLAN FOR OBESITY:  Recommended Dietary Goals  Harlene is currently in the action stage of change. As such, her goal is to continue weight management plan. She has agreed to the Category 2 Plan.  Behavioral Intervention  We discussed the following Behavioral Modification Strategies today: increasing lean protein intake, decreasing simple carbohydrates , increasing vegetables, increasing lower glycemic fruits, increasing water intake, reading food labels , keeping healthy foods at home, continue to practice mindfulness when eating, and planning for success.  Additional resources provided today: NA  Recommended Physical Activity Goals  Glenyce has been advised to work up to 150 minutes of moderate intensity aerobic activity a week and strengthening exercises 2-3 times per week for cardiovascular health, weight loss maintenance and preservation of muscle mass.   She has agreed to Continue current level of physical activity    Pharmacotherapy We discussed various medication options to help Ainslee with her weight loss efforts and we both agreed to start [facility] 0.25mg .  Side effects discussed.  Patient is menopausal    Contraindications:  Pancreatitis (active gallstones) Medullary thyroid cancer High triglycerides (>500)-will need labs prior to starting Multiple Endocrine Neoplasia syndrome type 2 (MEN 2) Trying to get pregnant Breastfeeding Use with caution with taking insulin or sulfonylureas (will need to monitor blood sugars for hypoglycemia)  ASSOCIATED  CONDITIONS ADDRESSED TODAY  Action/Plan  Essential hypertension -     UVOZDG; Inject 0.25 mg into the skin once a week.  Dispense: 2 mL; Refill: 0  Continue to follow up with PCP.  Continue meds as directed.   Morbid obesity (HCC) -     UYQIHK; Inject 0.25 mg into the skin once a week.  Dispense: 2 mL; Refill: 0  BMI 50.0-59.9, adult (HCC) -     VQQVZD; Inject 0.25 mg into the skin once a week.  Dispense: 2 mL; Refill: 0         Return in about 3 weeks (around 04/28/2023).Marland Kitchen She was informed of the importance of frequent follow up visits to maximize her success with intensive lifestyle modifications for her multiple health conditions.   ATTESTASTION STATEMENTS:  Reviewed by clinician on day of visit: allergies, medications, problem list, medical history, surgical history, family history,  social history, and previous encounter notes.   Theodis Sato. Caylor Cerino FNP-C

## 2023-04-22 ENCOUNTER — Other Ambulatory Visit: Payer: Self-pay | Admitting: Bariatrics

## 2023-04-22 DIAGNOSIS — I10 Essential (primary) hypertension: Secondary | ICD-10-CM

## 2023-04-27 DIAGNOSIS — F419 Anxiety disorder, unspecified: Secondary | ICD-10-CM | POA: Diagnosis not present

## 2023-04-29 ENCOUNTER — Ambulatory Visit: Payer: BC Managed Care – PPO | Admitting: Nurse Practitioner

## 2023-05-11 DIAGNOSIS — F419 Anxiety disorder, unspecified: Secondary | ICD-10-CM | POA: Diagnosis not present

## 2023-05-18 ENCOUNTER — Ambulatory Visit: Payer: BC Managed Care – PPO | Admitting: Nurse Practitioner

## 2023-05-18 ENCOUNTER — Encounter: Payer: Self-pay | Admitting: Nurse Practitioner

## 2023-05-18 VITALS — BP 135/84 | HR 67 | Temp 98.1°F | Ht 61.5 in | Wt 279.0 lb

## 2023-05-18 DIAGNOSIS — E65 Localized adiposity: Secondary | ICD-10-CM

## 2023-05-18 DIAGNOSIS — Z6841 Body Mass Index (BMI) 40.0 and over, adult: Secondary | ICD-10-CM

## 2023-05-18 DIAGNOSIS — R7303 Prediabetes: Secondary | ICD-10-CM

## 2023-05-18 NOTE — Patient Instructions (Signed)

## 2023-05-18 NOTE — Progress Notes (Signed)
Office: [phone]  /  Fax: [phone]  WEIGHT SUMMARY AND BIOMETRICS  Weight Lost Since Last Visit: 0lb  Weight Gained Since Last Visit: 0lb   Vitals Temp: 98.1 F (36.7 C) BP: 135/84 Pulse Rate: 67 SpO2: 98 %   Anthropometric Measurements Height: 5' 1.5" (1.562 m) Weight: 279 lb (126.6 kg) BMI (Calculated): 51.87 Weight at Last Visit: 279lb Weight Lost Since Last Visit: 0lb Weight Gained Since Last Visit: 0lb Starting Weight: 277lb Total Weight Loss (lbs): 0 lb (0 kg)   Body Composition  Body Fat %: 56.6 % Fat Mass (lbs): 158.2 lbs Muscle Mass (lbs): 115 lbs Total Body Water (lbs): 89.8 lbs Visceral Fat Rating : 22   Other Clinical Data Fasting: Yes Labs: No Today's Visit #: 5 Starting Date: 02/03/23     HPI  Chief Complaint: OBESITY  Angela Stanley is here to discuss her progress with her obesity treatment plan. She is on the the Category 2 Plan and states she is following her eating plan approximately 45 % of the time. She states she is exercising 45 minutes 2-3 days per week.   Interval History:  Since last office visit she has maintained her weight.  She was initially tracking but hasn't been tracking recently.   She is active and is trying to do better "on my food". She is traveling this week and will be limited on food choices.    Pharmacotherapy for weight loss: She is not currently taking medications  for medical weight loss.    Previous pharmacotherapy for medical weight loss:  GLP-1 (Ozempic), some fatigue.   Bariatric surgery:  Patient has not had bariatric surgery  Visceral obesity Today rating was 22  Prediabetes Last A1c was 6.1  Medication(s): none Polyphagia:Yes Lab Results  Component Value Date   HGBA1C 6.1 (H) 02/03/2023   HGBA1C 5.6 05/28/2020   HGBA1C 5.6 05/28/2020   HGBA1C 5.6 (A) 05/28/2020   HGBA1C 5.6 05/28/2020   Lab Results  Component Value Date   INSULIN 7.8 02/03/2023   PHYSICAL EXAM:  Blood pressure  135/84, pulse 67, temperature 98.1 F (36.7 C), height 5' 1.5" (1.562 m), weight 279 lb (126.6 kg), SpO2 98%. Body mass index is 51.86 kg/m.  General: She is overweight, cooperative, alert, well developed, and in no acute distress. PSYCH: Has normal mood, affect and thought process.   Extremities: No edema.  Neurologic: No gross sensory or motor deficits. No tremors or fasciculations noted.    DIAGNOSTIC DATA REVIEWED:  BMET    Component Value Date/Time   NA 141 02/03/2023 1121   K 4.2 02/03/2023 1121   CL 102 02/03/2023 1121   CO2 25 02/03/2023 1121   GLUCOSE 92 02/03/2023 1121   GLUCOSE 124 (H) 05/15/2020 0010   BUN 10 02/03/2023 1121   CREATININE 0.67 02/03/2023 1121   CALCIUM 9.0 02/03/2023 1121   GFRNONAA >60 05/15/2020 0010   GFRAA >60 05/15/2020 0010   Lab Results  Component Value Date   HGBA1C 6.1 (H) 02/03/2023   HGBA1C 5.6 (A) 05/28/2020   Lab Results  Component Value Date   INSULIN 7.8 02/03/2023   Lab Results  Component Value Date   TSH 0.936 02/03/2023   CBC    Component Value Date/Time   WBC 4.9 02/03/2023 1121   WBC 7.1 05/15/2020 0010   RBC 4.59 02/03/2023 1121   RBC 4.61 05/15/2020 0010   HGB 13.3 02/03/2023 1121   HCT 40.4 02/03/2023 1121   PLT 227 02/03/2023 1121  MCV 88 02/03/2023 1121   MCH 29.0 02/03/2023 1121   MCH 28.9 05/15/2020 0010   MCHC 32.9 02/03/2023 1121   MCHC 32.8 05/15/2020 0010   RDW 14.0 02/03/2023 1121   Iron Studies No results found for: "IRON", "TIBC", "FERRITIN", "IRONPCTSAT" Lipid Panel     Component Value Date/Time   CHOL 257 (H) 02/03/2023 1121   TRIG 116 02/03/2023 1121   HDL 49 02/03/2023 1121   LDLCALC 187 (H) 02/03/2023 1121   Hepatic Function Panel     Component Value Date/Time   PROT 6.3 02/03/2023 1121   ALBUMIN 3.8 02/03/2023 1121   AST 14 02/03/2023 1121   ALT 9 02/03/2023 1121   ALKPHOS 141 (H) 02/03/2023 1121   BILITOT 0.6 02/03/2023 1121      Component Value Date/Time   TSH 0.936  02/03/2023 1121   Nutritional Lab Results  Component Value Date   VD25OH 29.0 (L) 02/03/2023     ASSESSMENT AND PLAN  TREATMENT PLAN FOR OBESITY:  Recommended Dietary Goals  Jenavi is currently in the action stage of change. As such, her goal is to continue weight management plan. She has agreed to the Category 2 Plan-1200 calories and 80 grams of protein.   Recommended apps: Mynetdiary Myfitnesspal loseitapp  Behavioral Intervention  We discussed the following Behavioral Modification Strategies today: increasing lean protein intake, decreasing simple carbohydrates , increasing vegetables, increasing lower glycemic fruits, increasing water intake, work on meal planning and preparation, work on Counselling psychologist calories using tracking application, reading food labels , keeping healthy foods at home, continue to practice mindfulness when eating, and planning for success.  Additional resources provided today: NA  Recommended Physical Activity Goals  Cassundra has been advised to work up to 150 minutes of moderate intensity aerobic activity a week and strengthening exercises 2-3 times per week for cardiovascular health, weight loss maintenance and preservation of muscle mass.   She has agreed to Continue current level of physical activity    Pharmacotherapy We discussed various medication options to help Azharia with her weight loss efforts and she is not interested in starting Topamax, Metformin or Wellbutrin.  Also discussed using Victoza off label.  Unable to start GLP-1s due to cost  She is not a candidate for Qsymia or phentermine secondary to HTN.    ASSOCIATED CONDITIONS ADDRESSED TODAY  Action/Plan  Visceral obesity Continue working on dietary changes, exercise and weight loss  Prediabetes Deslyn will continue to work on weight loss, exercise, and decreasing simple carbohydrates to help decrease the risk of diabetes.    Offered to obtain labs today.  Patient  would like to wait until next visit  Discussed Metformin  Morbid obesity (HCC)  BMI 50.0-59.9, adult (HCC)     Return in about 3 weeks (around 06/08/2023).Marland Kitchen She was informed of the importance of frequent follow up visits to maximize her success with intensive lifestyle modifications for her multiple health conditions.   ATTESTASTION STATEMENTS:  Reviewed by clinician on day of visit: allergies, medications, problem list, medical history, surgical history, family history, social history, and previous encounter notes.   Time spent on visit including pre-visit chart review and post-visit care and charting was 20+ minutes.    Theodis Sato. Deagen Krass FNP-C

## 2023-06-01 ENCOUNTER — Encounter: Payer: Self-pay | Admitting: Family Medicine

## 2023-06-01 ENCOUNTER — Ambulatory Visit: Payer: BC Managed Care – PPO | Admitting: Family Medicine

## 2023-06-01 VITALS — BP 129/81 | HR 72 | Temp 98.1°F | Resp 18 | Ht 62.0 in | Wt 283.7 lb

## 2023-06-01 DIAGNOSIS — Z7689 Persons encountering health services in other specified circumstances: Secondary | ICD-10-CM | POA: Diagnosis not present

## 2023-06-01 DIAGNOSIS — N951 Menopausal and female climacteric states: Secondary | ICD-10-CM

## 2023-06-01 MED ORDER — PROGESTERONE MICRONIZED 100 MG PO CAPS
100.0000 mg | ORAL_CAPSULE | Freq: Every day | ORAL | 1 refills | Status: DC
Start: 2023-06-01 — End: 2023-06-29

## 2023-06-01 NOTE — Progress Notes (Signed)
New Patient Office Visit  Subjective    Patient ID: Angela Stanley, female    DOB: November 18, 1965  Age: 57 y.o. MRN: [id number]  CC:  Chief Complaint  Patient presents with   Establish Care    Patient is here to establish care with a new PCP, Patient state that she would like to discuss perimenopause, she is experiencing hot flash, weight increase, insomnia,  and trouble focusing    HPI Angela Stanley presents to establish care. Pt is new to me.  Pt is concerned with perimenopause. She has been experiencing hot flashes, insomnia, and weight gain. She reports she use to be on Lunesta for insomnia but stopped due to it being a controlled substance. She also says that she is seeing healthy weight and wellness to help with weight but her insurance wouldn't pay for any of the weight loss medications. She reports she has been on Progesterone 50mg  at night to help but hasn't helped much for her hot flashes. She was on 100mg  many years ago but had vaginal bleeding. This is when the dose was decreased. She hasn't seen any benefit from the 50mg  dose.  She has a gynecologist that she sees Angela Stanley. She is up to date with pap smear and mammogram.  Her last colonoscopy with Dr Angela Stanley in the last year.     Outpatient Encounter Medications as of 06/01/2023  Medication Sig   chlorthalidone (HYGROTON) 25 MG tablet Take 1 tablet (25 mg total) by mouth daily.   FIBER PO Take by mouth.   MAGNESIUM CHLORIDE PO Take by mouth.   nebivolol (BYSTOLIC) 10 MG tablet    VALERIAN ROOT PO Take by mouth.   [DISCONTINUED] progesterone (PROMETRIUM) 100 MG capsule 50 mg See admin instructions.   progesterone (PROMETRIUM) 100 MG capsule Take 1 capsule (100 mg total) by mouth daily.   Vitamin D, Ergocalciferol, (DRISDOL) 1.25 MG (50000 UNIT) CAPS capsule Take 1 capsule (50,000 Units total) by mouth every 7 (seven) days. (Patient not taking: Reported on 06/01/2023)   [DISCONTINUED] Semaglutide-Weight  Management (WEGOVY) 0.25 MG/0.5ML SOAJ Inject 0.25 mg into the skin once a week. (Patient not taking: Reported on 05/18/2023)   No facility-administered encounter medications on file as of 06/01/2023.    Past Medical History:  Diagnosis Date   Anxiety    Depression    High blood pressure    High cholesterol    Hyperlipidemia    Lactose intolerance    Obesity    Palpitations    Prediabetes    Sleep apnea    Vitamin D deficiency     History reviewed. No pertinent surgical history.  Family History  Problem Relation Age of Onset   Hypertension Mother    High Cholesterol Mother    Depression Mother    Obesity Mother    Heart attack Father    High blood pressure Father    Heart disease Father    Alcoholism Father    Obesity Father    Stroke Sister    Diabetes Sister    Hypertension Sister    Hypertension Brother    Diabetes Other    Hyperlipidemia Other    Heart attack Other    Obesity Other     Social History   Socioeconomic History   Marital status: Single    Spouse name: Not on file   Number of children: Not on file   Years of education: Not on file   Highest education level: Not on file  Occupational History   Occupation: Engineer, materials free afterschool and summer tutoring  Tobacco Use   Smoking status: Never    Passive exposure: Never   Smokeless tobacco: Never  Vaping Use   Vaping status: Never Used  Substance and Sexual Activity   Alcohol use: Never   Drug use: Never   Sexual activity: Not Currently  Other Topics Concern   Not on file  Social History Narrative   Not on file   Social Determinants of Health   Financial Resource Strain: Not on file  Food Insecurity: Not on file  Transportation Needs: Not on file  Physical Activity: Not on file  Stress: Not on file  Social Connections: Not on file  Intimate Partner Violence: Not on file    Review of Systems  Constitutional:        Weight gain  Psychiatric/Behavioral:  The patient  has insomnia.        Hot flashes  All other systems reviewed and are negative.       Objective    BP 129/81   Pulse 72   Temp 98.1 F (36.7 C) (Oral)   Resp 18   Ht 5\' 2"  (1.575 m)   Wt 283 lb 11.2 oz (128.7 kg)   LMP  (LMP Unknown)   SpO2 98%   BMI 51.89 kg/m   Physical Exam Vitals and nursing note reviewed.  Constitutional:      Appearance: Normal appearance. She is obese.  HENT:     Head: Normocephalic and atraumatic.     Right Ear: External ear normal.     Left Ear: External ear normal.     Nose: Nose normal.     Mouth/Throat:     Mouth: Mucous membranes are moist.     Pharynx: Oropharynx is clear.  Eyes:     Extraocular Movements: Extraocular movements intact.     Conjunctiva/sclera: Conjunctivae normal.  Cardiovascular:     Rate and Rhythm: Normal rate.  Pulmonary:     Effort: Pulmonary effort is normal.  Skin:    Capillary Refill: Capillary refill takes less than 2 seconds.  Neurological:     General: No focal deficit present.     Mental Status: She is alert and oriented to person, place, and time. Mental status is at baseline.  Psychiatric:        Mood and Affect: Mood normal.        Behavior: Behavior normal.        Thought Content: Thought content normal.        Judgment: Judgment normal.        Assessment & Plan:   Problem List Items Addressed This Visit   None Visit Diagnoses     Encounter to establish care with new doctor    -  Primary   Perimenopausal vasomotor symptoms       Relevant Medications   progesterone (PROMETRIUM) 100 MG capsule      Encounter to establish care with new doctor  Perimenopausal vasomotor symptoms -     Progesterone; Take 1 capsule (100 mg total) by mouth daily.  Dispense: 30 capsule; Refill: 1  Pt with perimenopausal symptoms for years but worsening. Will try increasing Progesterone to 100mg  at night. Adjust dose if needed. If vaginal bleeding occurs again with this dose, will try SSRI or gabapentin. Pt  is in agreement with plan. Will plan to see back in 6 weeks for CPE. Return in about 6 weeks (around 07/14/2023) for Annual Physical.  Angela Slick, MD

## 2023-06-08 ENCOUNTER — Ambulatory Visit: Payer: BC Managed Care – PPO | Admitting: Nurse Practitioner

## 2023-06-08 DIAGNOSIS — F419 Anxiety disorder, unspecified: Secondary | ICD-10-CM | POA: Diagnosis not present

## 2023-06-19 ENCOUNTER — Other Ambulatory Visit: Payer: Self-pay | Admitting: Bariatrics

## 2023-06-19 DIAGNOSIS — I10 Essential (primary) hypertension: Secondary | ICD-10-CM

## 2023-06-19 DIAGNOSIS — E559 Vitamin D deficiency, unspecified: Secondary | ICD-10-CM

## 2023-06-22 DIAGNOSIS — F419 Anxiety disorder, unspecified: Secondary | ICD-10-CM | POA: Diagnosis not present

## 2023-06-23 ENCOUNTER — Ambulatory Visit: Payer: BC Managed Care – PPO | Admitting: Nurse Practitioner

## 2023-06-23 ENCOUNTER — Encounter: Payer: Self-pay | Admitting: Nurse Practitioner

## 2023-06-23 VITALS — BP 129/84 | HR 60 | Temp 98.2°F | Ht 62.0 in | Wt 279.0 lb

## 2023-06-23 DIAGNOSIS — Z79899 Other long term (current) drug therapy: Secondary | ICD-10-CM | POA: Diagnosis not present

## 2023-06-23 DIAGNOSIS — E7800 Pure hypercholesterolemia, unspecified: Secondary | ICD-10-CM | POA: Diagnosis not present

## 2023-06-23 DIAGNOSIS — R7303 Prediabetes: Secondary | ICD-10-CM | POA: Diagnosis not present

## 2023-06-23 DIAGNOSIS — E559 Vitamin D deficiency, unspecified: Secondary | ICD-10-CM | POA: Diagnosis not present

## 2023-06-23 DIAGNOSIS — Z6841 Body Mass Index (BMI) 40.0 and over, adult: Secondary | ICD-10-CM

## 2023-06-23 DIAGNOSIS — I10 Essential (primary) hypertension: Secondary | ICD-10-CM

## 2023-06-23 MED ORDER — CHLORTHALIDONE 25 MG PO TABS: 25.0000 mg | ORAL_TABLET | Freq: Every day | ORAL | 0 refills | Status: DC

## 2023-06-23 MED ORDER — VITAMIN D (ERGOCALCIFEROL) 1.25 MG (50000 UNIT) PO CAPS
50000.0000 [IU] | ORAL_CAPSULE | ORAL | 0 refills | Status: DC
Start: 2023-06-23 — End: 2024-01-18

## 2023-06-23 NOTE — Progress Notes (Signed)
Office: [phone]  /  Fax: [phone]  WEIGHT SUMMARY AND BIOMETRICS  Weight Lost Since Last Visit: 0lb  Weight Gained Since Last Visit: 0lb   Vitals Temp: 98.2 F (36.8 C) BP: 129/84 Pulse Rate: 60 SpO2: 98 %   Anthropometric Measurements Height: 5\' 2"  (1.575 m) Weight: 279 lb (126.6 kg) BMI (Calculated): 51.02 Weight at Last Visit: 279lb Weight Lost Since Last Visit: 0lb Weight Gained Since Last Visit: 0lb Starting Weight: 277lb Total Weight Loss (lbs): 0 lb (0 kg)   Body Composition  Body Fat %: 56.5 % Fat Mass (lbs): 158 lbs Muscle Mass (lbs): 115.4 lbs Total Body Water (lbs): 90.2 lbs Visceral Fat Rating : 22   Other Clinical Data Fasting: Yes Labs: No Today's Visit #: 6 Starting Date: 02/03/23     HPI  Chief Complaint: OBESITY  Angela Stanley is here to discuss her progress with her obesity treatment plan. She is on the the Category 2 Plan and states she is following her eating plan approximately 30 % of the time. She states she is exercising 30 minutes 3 days per week.   Interval History:  Since last office visit she has maintained her weight.  She is averaging around 1485-1877 calories, 88-124 grams of protein and 164-214 carbs.  She is struggling with hunger and cravings.  She is drinking water, Bai drinks and Tru.    Pharmacotherapy for weight loss: She is not currently taking medications  for medical weight loss.     Previous pharmacotherapy for medical weight loss:  GLP-1 (Ozempic), some fatigue.    Bariatric surgery:  Patient has not had bariatric surgery  Prediabetes Last A1c was 6.1  Medication(s): None Polyphagia:Yes Lab Results  Component Value Date   HGBA1C 6.1 (H) 02/03/2023   HGBA1C 5.6 05/28/2020   HGBA1C 5.6 05/28/2020   HGBA1C 5.6 (A) 05/28/2020   HGBA1C 5.6 05/28/2020   Lab Results  Component Value Date   INSULIN 7.8 02/03/2023   Vit D deficiency  She is taking Vit D 50,000 IU weekly.  Denies side effects.  Denies  nausea, vomiting or muscle weakness.    Lab Results  Component Value Date   VD25OH 29.0 (L) 02/03/2023     Hyperlipidemia Medication(s): None  Lab Results  Component Value Date   CHOL 257 (H) 02/03/2023   HDL 49 02/03/2023   LDLCALC 187 (H) 02/03/2023   TRIG 116 02/03/2023   Lab Results  Component Value Date   ALT 9 02/03/2023   AST 14 02/03/2023   ALKPHOS 141 (H) 02/03/2023   BILITOT 0.6 02/03/2023   The 10-year ASCVD risk score (Arnett DK, et al., 2019) is: 7.1%   Values used to calculate the score:     Age: 57 years     Sex: Female     Is Non-Hispanic African American: Yes     Diabetic: No     Tobacco smoker: No     Systolic Blood Pressure: 129 mmHg     Is BP treated: Yes     HDL Cholesterol: 49 mg/dL     Total Cholesterol: 257 mg/dL   Hypertension Hypertension Looks better.  Medication(s): Hygroton 25mg  and Bystolic 10mg .  Denies side effects Denies chest pain, palpitations and SOB.  BP Readings from Last 3 Encounters:  06/23/23 129/84  06/01/23 129/81  05/18/23 135/84   Lab Results  Component Value Date   CREATININE 0.67 02/03/2023   CREATININE 0.63 05/15/2020    PHYSICAL EXAM:  Blood pressure 129/84, pulse 60,  temperature 98.2 F (36.8 C), height 5\' 2"  (1.575 m), weight 279 lb (126.6 kg), SpO2 98%. Body mass index is 51.03 kg/m.  General: She is overweight, cooperative, alert, well developed, and in no acute distress. PSYCH: Has normal mood, affect and thought process.   Extremities: No edema.  Neurologic: No gross sensory or motor deficits. No tremors or fasciculations noted.    DIAGNOSTIC DATA REVIEWED:  BMET    Component Value Date/Time   NA 141 02/03/2023 1121   K 4.2 02/03/2023 1121   CL 102 02/03/2023 1121   CO2 25 02/03/2023 1121   GLUCOSE 92 02/03/2023 1121   GLUCOSE 124 (H) 05/15/2020 0010   BUN 10 02/03/2023 1121   CREATININE 0.67 02/03/2023 1121   CALCIUM 9.0 02/03/2023 1121   GFRNONAA >60 05/15/2020 0010   GFRAA >60  05/15/2020 0010   Lab Results  Component Value Date   HGBA1C 6.1 (H) 02/03/2023   HGBA1C 5.6 (A) 05/28/2020   Lab Results  Component Value Date   INSULIN 7.8 02/03/2023   Lab Results  Component Value Date   TSH 0.936 02/03/2023   CBC    Component Value Date/Time   WBC 4.9 02/03/2023 1121   WBC 7.1 05/15/2020 0010   RBC 4.59 02/03/2023 1121   RBC 4.61 05/15/2020 0010   HGB 13.3 02/03/2023 1121   HCT 40.4 02/03/2023 1121   PLT 227 02/03/2023 1121   MCV 88 02/03/2023 1121   MCH 29.0 02/03/2023 1121   MCH 28.9 05/15/2020 0010   MCHC 32.9 02/03/2023 1121   MCHC 32.8 05/15/2020 0010   RDW 14.0 02/03/2023 1121   Iron Studies No results found for: "IRON", "TIBC", "FERRITIN", "IRONPCTSAT" Lipid Panel     Component Value Date/Time   CHOL 257 (H) 02/03/2023 1121   TRIG 116 02/03/2023 1121   HDL 49 02/03/2023 1121   LDLCALC 187 (H) 02/03/2023 1121   Hepatic Function Panel     Component Value Date/Time   PROT 6.3 02/03/2023 1121   ALBUMIN 3.8 02/03/2023 1121   AST 14 02/03/2023 1121   ALT 9 02/03/2023 1121   ALKPHOS 141 (H) 02/03/2023 1121   BILITOT 0.6 02/03/2023 1121      Component Value Date/Time   TSH 0.936 02/03/2023 1121   Nutritional Lab Results  Component Value Date   VD25OH 29.0 (L) 02/03/2023     ASSESSMENT AND PLAN  TREATMENT PLAN FOR OBESITY:  Recommended Dietary Goals  Angela Stanley is currently in the action stage of change. As such, her goal is to continue weight management plan. She has agreed to keeping a food journal and adhering to recommended goals of 1200-1300 calories and 80+ protein.  Her calories and carbs are too high.  I reviewed her food diary extensively with her today. Recommendations discussed.    Behavioral Intervention  We discussed the following Behavioral Modification Strategies today: increasing lean protein intake to established goals, decreasing simple carbohydrates , increasing vegetables, increasing water intake , work on  meal planning and preparation, work on tracking and journaling calories using tracking application, and continue to work on maintaining a reduced calorie state, getting the recommended amount of protein, incorporating whole foods, making healthy choices, staying well hydrated and practicing mindfulness when eating..  Additional resources provided today: 100 & 200 calories snack ideas, dining out guide  Recommended Physical Activity Goals  Angela Stanley has been advised to work up to 150 minutes of moderate intensity aerobic activity a week and strengthening exercises 2-3 times per week for  cardiovascular health, weight loss maintenance and preservation of muscle mass.   She has agreed to Think about enjoyable ways to increase daily physical activity and overcoming barriers to exercise, Increase physical activity in their day and reduce sedentary time (increase NEAT)., Increase the intensity, frequency or duration of strengthening exercises , and Increase the intensity, frequency or duration of aerobic exercises     Pharmacotherapy We discussed various medication options to help Angela Stanley with her weight loss efforts and we both agreed to consider options based upon lab results.  Unable to start GLP-1s due to cost   She is not a candidate for Qsymia or phentermine secondary to HTN. HTN does look better.    ASSOCIATED CONDITIONS ADDRESSED TODAY  Action/Plan  Prediabetes -     Hemoglobin A1c -     Insulin, random  Angela Stanley will continue to work on weight loss, exercise, and decreasing simple carbohydrates to help decrease the risk of diabetes.    Vitamin D deficiency -     Vitamin D (Ergocalciferol); Take 1 capsule (50,000 Units total) by mouth every 7 (seven) days.  Dispense: 5 capsule; Refill: 0 -     VITAMIN D 25 Hydroxy (Vit-D Deficiency, Fractures)  Low Vitamin D level contributes to fatigue and are associated with obesity, breast, and colon cancer. She agrees to continue to take prescription  Vitamin D @50 ,000 IU every week and will follow-up for routine testing of Vitamin D, at least 2-3 times per year to avoid over-replacement.   Elevated cholesterol -     Lipid Panel With LDL/HDL Ratio  Essential hypertension -     Chlorthalidone; Take 1 tablet (25 mg total) by mouth daily.  Dispense: 30 tablet; Refill: 0  Continue to follow up with PCP.  Continue meds as directed.   Medication management -     Comprehensive metabolic panel  Morbid obesity (HCC)  BMI 50.0-59.9, adult (HCC)         Return in about 3 weeks (around 07/14/2023).Marland Kitchen She was informed of the importance of frequent follow up visits to maximize her success with intensive lifestyle modifications for her multiple health conditions.   ATTESTASTION STATEMENTS:  Reviewed by clinician on day of visit: allergies, medications, problem list, medical history, surgical history, family history, social history, and previous encounter notes.     Theodis Sato. Angela Syme FNP-C

## 2023-06-24 LAB — COMPREHENSIVE METABOLIC PANEL
ALT: 12 [IU]/L (ref 0–32)
AST: 14 [IU]/L (ref 0–40)
Albumin: 3.8 g/dL (ref 3.8–4.9)
Alkaline Phosphatase: 129 [IU]/L — ABNORMAL HIGH (ref 44–121)
BUN/Creatinine Ratio: 16 (ref 9–23)
BUN: 10 mg/dL (ref 6–24)
Bilirubin Total: 0.6 mg/dL (ref 0.0–1.2)
CO2: 25 mmol/L (ref 20–29)
Calcium: 9 mg/dL (ref 8.7–10.2)
Chloride: 104 mmol/L (ref 96–106)
Creatinine, Ser: 0.64 mg/dL (ref 0.57–1.00)
Globulin, Total: 2.5 g/dL (ref 1.5–4.5)
Glucose: 94 mg/dL (ref 70–99)
Potassium: 4 mmol/L (ref 3.5–5.2)
Sodium: 141 mmol/L (ref 134–144)
Total Protein: 6.3 g/dL (ref 6.0–8.5)
eGFR: 104 mL/min/{1.73_m2} (ref >59)

## 2023-06-24 LAB — INSULIN, RANDOM: INSULIN: 10.3 u[IU]/mL (ref 2.6–24.9)

## 2023-06-24 LAB — LIPID PANEL WITH LDL/HDL RATIO
Cholesterol, Total: 258 mg/dL — ABNORMAL HIGH (ref 100–199)
HDL: 51 mg/dL (ref >39)
LDL Chol Calc (NIH): 185 mg/dL — ABNORMAL HIGH (ref 0–99)
LDL/HDL Ratio: 3.6 ratio — ABNORMAL HIGH (ref 0.0–3.2)
Triglycerides: 122 mg/dL (ref 0–149)
VLDL Cholesterol Cal: 22 mg/dL (ref 5–40)

## 2023-06-24 LAB — HEMOGLOBIN A1C
Est. average glucose Bld gHb Est-mCnc: 131 mg/dL
Hgb A1c MFr Bld: 6.2 % — ABNORMAL HIGH (ref 4.8–5.6)

## 2023-06-24 LAB — VITAMIN D 25 HYDROXY (VIT D DEFICIENCY, FRACTURES): Vit D, 25-Hydroxy: 34.7 ng/mL (ref 30.0–100.0)

## 2023-06-27 ENCOUNTER — Other Ambulatory Visit: Payer: Self-pay | Admitting: Family Medicine

## 2023-06-27 DIAGNOSIS — N951 Menopausal and female climacteric states: Secondary | ICD-10-CM

## 2023-07-06 DIAGNOSIS — F419 Anxiety disorder, unspecified: Secondary | ICD-10-CM | POA: Diagnosis not present

## 2023-07-14 ENCOUNTER — Ambulatory Visit: Payer: BC Managed Care – PPO | Admitting: Nurse Practitioner

## 2023-07-15 ENCOUNTER — Other Ambulatory Visit: Payer: Self-pay | Admitting: Nurse Practitioner

## 2023-07-15 DIAGNOSIS — I10 Essential (primary) hypertension: Secondary | ICD-10-CM

## 2023-07-20 ENCOUNTER — Encounter: Payer: Self-pay | Admitting: Family Medicine

## 2023-07-20 ENCOUNTER — Ambulatory Visit: Payer: BC Managed Care – PPO | Admitting: Family Medicine

## 2023-07-20 VITALS — BP 146/86 | HR 66 | Temp 97.6°F | Resp 18 | Ht 62.0 in | Wt 284.8 lb

## 2023-07-20 DIAGNOSIS — Z0000 Encounter for general adult medical examination without abnormal findings: Secondary | ICD-10-CM | POA: Diagnosis not present

## 2023-07-20 NOTE — Progress Notes (Signed)
Complete physical exam  Patient: Angela Stanley   DOB: 1966/07/13   57 y.o. Female  MRN: [id number]  Subjective:    Chief Complaint  Patient presents with   Annual Exam    Patient states that she is here for her annual physical and she will not be getting a pap done here she states that she will be going to her OB-GYN in January     Angela Stanley is a 57 y.o. female who presents today for a complete physical exam. She reports consuming a  [facility] diet plan/calorie deficit  diet. Home exercise routine includes 3 times a week consists of peloton, yoga . She generally feels fairly well. She reports sleeping fairly well. She does not have additional problems to discuss today.  Pt is up to date with pap, mammogram, and colonoscopy. Will upload those files to mychart. Declines all vaccines.  Most recent fall risk assessment:    06/01/2023    3:52 PM  Fall Risk   Number falls in past yr: 0  Risk for fall due to : No Fall Risks  Follow up Falls evaluation completed     Most recent depression screenings:    06/01/2023    3:52 PM 05/28/2020    9:32 AM  PHQ 2/9 Scores  PHQ - 2 Score 0 0  PHQ- 9 Score 4   Exception Documentation  Medical reason    Vision:Within last year  Patient Active Problem List   Diagnosis Date Noted   Elevated cholesterol 03/02/2023   Other fatigue 02/03/2023   SOB (shortness of breath) on exertion 02/03/2023   Prediabetes 02/03/2023   Essential hypertension 02/03/2023   Vitamin D deficiency 02/03/2023   BMI 50.0-59.9, adult (HCC) 02/03/2023   Tinnitus 10/11/2020   Snoring 10/08/2020   Witnessed episode of apnea 10/08/2020   Excessive sleepiness 10/08/2020   Mixed hyperlipidemia 08/30/2020   Precordial pain 08/30/2020   Laryngopharyngeal reflux (LPR) 05/08/2020   History reviewed. No pertinent surgical history. Social History   Tobacco Use   Smoking status: Never    Passive exposure: Never   Smokeless tobacco: Never  Vaping Use    Vaping status: Never Used  Substance Use Topics   Alcohol use: Never   Drug use: Never   Family Status  Relation Name Status   Mother  (Not Specified)   Father  (Not Specified)   Sister  (Not Specified)   Brother  (Not Specified)   Other  (Not Specified)  No partnership data on file   Allergies  Allergen Reactions   Acetaminophen Anaphylaxis   Mushroom Extract Complex Other (See Comments)      Patient Care Team: Suzan Slick, MD as PCP - General (Family Medicine)   Outpatient Medications Prior to Visit  Medication Sig   chlorthalidone (HYGROTON) 25 MG tablet Take 1 tablet (25 mg total) by mouth daily.   FIBER PO Take by mouth.   MAGNESIUM CHLORIDE PO Take by mouth.   nebivolol (BYSTOLIC) 10 MG tablet    progesterone (PROMETRIUM) 100 MG capsule TAKE 1 CAPSULE BY MOUTH EVERY DAY   VALERIAN ROOT PO Take by mouth.   Vitamin D, Ergocalciferol, (DRISDOL) 1.25 MG (50000 UNIT) CAPS capsule Take 1 capsule (50,000 Units total) by mouth every 7 (seven) days.   No facility-administered medications prior to visit.    Review of Systems  All other systems reviewed and are negative.         Objective:     BP Marland Kitchen)  146/86   Pulse 66   Temp 97.6 F (36.4 C) (Oral)   Resp 18   Ht 5\' 2"  (1.575 m)   Wt 284 lb 12.8 oz (129.2 kg)   LMP  (LMP Unknown)   SpO2 99%   BMI 52.09 kg/m  BP Readings from Last 3 Encounters:  07/20/23 (!) 146/86  06/23/23 129/84  06/01/23 129/81      Physical Exam Vitals and nursing note reviewed.  Constitutional:      Appearance: Normal appearance. She is obese.  HENT:     Head: Normocephalic and atraumatic.     Right Ear: Tympanic membrane, ear canal and external ear normal.     Left Ear: Tympanic membrane, ear canal and external ear normal.     Nose: Nose normal.     Mouth/Throat:     Mouth: Mucous membranes are moist.     Pharynx: Oropharynx is clear.  Eyes:     Conjunctiva/sclera: Conjunctivae normal.     Pupils: Pupils are  equal, round, and reactive to light.  Cardiovascular:     Rate and Rhythm: Normal rate and regular rhythm.     Pulses: Normal pulses.     Heart sounds: Normal heart sounds.  Pulmonary:     Effort: Pulmonary effort is normal.     Breath sounds: Normal breath sounds.  Abdominal:     General: Abdomen is flat. Bowel sounds are normal.  Skin:    General: Skin is warm.     Capillary Refill: Capillary refill takes less than 2 seconds.  Neurological:     General: No focal deficit present.     Mental Status: She is alert and oriented to person, place, and time. Mental status is at baseline.  Psychiatric:        Mood and Affect: Mood normal.        Behavior: Behavior normal.        Thought Content: Thought content normal.        Judgment: Judgment normal.      No results found for any visits on 07/20/23. Last CBC Lab Results  Component Value Date   WBC 4.9 02/03/2023   HGB 13.3 02/03/2023   HCT 40.4 02/03/2023   MCV 88 02/03/2023   MCH 29.0 02/03/2023   RDW 14.0 02/03/2023   PLT 227 02/03/2023   Last metabolic panel Lab Results  Component Value Date   GLUCOSE 94 06/23/2023   NA 141 06/23/2023   K 4.0 06/23/2023   CL 104 06/23/2023   CO2 25 06/23/2023   BUN 10 06/23/2023   CREATININE 0.64 06/23/2023   EGFR 104 06/23/2023   CALCIUM 9.0 06/23/2023   PROT 6.3 06/23/2023   ALBUMIN 3.8 06/23/2023   LABGLOB 2.5 06/23/2023   BILITOT 0.6 06/23/2023   ALKPHOS 129 (H) 06/23/2023   AST 14 06/23/2023   ALT 12 06/23/2023   ANIONGAP 9 05/15/2020   Last lipids Lab Results  Component Value Date   CHOL 258 (H) 06/23/2023   HDL 51 06/23/2023   LDLCALC 185 (H) 06/23/2023   TRIG 122 06/23/2023   Last hemoglobin A1c Lab Results  Component Value Date   HGBA1C 6.2 (H) 06/23/2023        Assessment & Plan:    Routine Health Maintenance and Physical Exam  Immunization History  Administered Date(s) Administered   MMR 04/18/2014   PFIZER(Purple Top)SARS-COV-2 Vaccination  10/17/2019, 11/09/2019   Unspecified SARS-COV-2 Vaccination 11/17/2019    Health Maintenance  Topic Date Due  HIV Screening  Never done   Hepatitis C Screening  Never done   Cervical Cancer Screening (HPV/Pap Cotest)  Never done   Colonoscopy  Never done   MAMMOGRAM  05/07/2018   COVID-19 Vaccine (4 - 2023-24 season) 08/05/2023 (Originally 04/19/2023)   Zoster Vaccines- Shingrix (1 of 2) 10/18/2023 (Originally 05/08/1987)   INFLUENZA VACCINE  11/16/2023 (Originally 03/19/2023)   DTaP/Tdap/Td (1 - Tdap) 07/19/2024 (Originally 05/08/1987)   HPV VACCINES  Aged Out    Discussed health benefits of physical activity, and encouraged her to engage in regular exercise appropriate for her age and condition.  Problem List Items Addressed This Visit   None Visit Diagnoses     Annual physical exam    -  Primary      Return in about 6 months (around 01/18/2024) for Chronic condition follow up. Labs done by [facility] last month. Was reviewed today. Declines vaccines Up to date with all other preventive health screenings. Will send to me on mychart.    Suzan Slick, MD

## 2023-07-27 ENCOUNTER — Ambulatory Visit: Payer: BC Managed Care – PPO | Admitting: Nurse Practitioner

## 2023-08-03 DIAGNOSIS — F419 Anxiety disorder, unspecified: Secondary | ICD-10-CM | POA: Diagnosis not present

## 2023-08-23 ENCOUNTER — Other Ambulatory Visit: Payer: Self-pay | Admitting: Nurse Practitioner

## 2023-08-23 DIAGNOSIS — I10 Essential (primary) hypertension: Secondary | ICD-10-CM

## 2023-08-24 DIAGNOSIS — F419 Anxiety disorder, unspecified: Secondary | ICD-10-CM | POA: Diagnosis not present

## 2023-08-31 ENCOUNTER — Encounter: Payer: Self-pay | Admitting: Family Medicine

## 2023-08-31 ENCOUNTER — Encounter: Payer: Self-pay | Admitting: Nurse Practitioner

## 2023-08-31 ENCOUNTER — Other Ambulatory Visit: Payer: Self-pay | Admitting: Nurse Practitioner

## 2023-08-31 DIAGNOSIS — I10 Essential (primary) hypertension: Secondary | ICD-10-CM

## 2023-08-31 MED ORDER — CHLORTHALIDONE 25 MG PO TABS: 25.0000 mg | ORAL_TABLET | Freq: Every day | ORAL | 1 refills | Status: DC

## 2023-08-31 NOTE — Addendum Note (Signed)
 Addended by: Suzan Slick on: 08/31/2023 04:13 PM   Modules accepted: Orders

## 2023-09-09 DIAGNOSIS — F419 Anxiety disorder, unspecified: Secondary | ICD-10-CM | POA: Diagnosis not present

## 2023-09-22 DIAGNOSIS — F419 Anxiety disorder, unspecified: Secondary | ICD-10-CM | POA: Diagnosis not present

## 2023-10-09 ENCOUNTER — Other Ambulatory Visit: Payer: Self-pay | Admitting: Obstetrics and Gynecology

## 2023-10-09 DIAGNOSIS — Z1231 Encounter for screening mammogram for malignant neoplasm of breast: Secondary | ICD-10-CM

## 2023-10-19 DIAGNOSIS — F419 Anxiety disorder, unspecified: Secondary | ICD-10-CM | POA: Diagnosis not present

## 2023-11-02 DIAGNOSIS — F419 Anxiety disorder, unspecified: Secondary | ICD-10-CM | POA: Diagnosis not present

## 2023-11-13 ENCOUNTER — Ambulatory Visit
Admission: RE | Admit: 2023-11-13 | Discharge: 2023-11-13 | Disposition: A | Discharge status: Home / Self Care | Payer: BC Managed Care – PPO | Source: Ambulatory Visit | Attending: Obstetrics and Gynecology | Admitting: Obstetrics and Gynecology

## 2023-11-13 DIAGNOSIS — Z1339 Encounter for screening examination for other mental health and behavioral disorders: Secondary | ICD-10-CM | POA: Diagnosis not present

## 2023-11-13 DIAGNOSIS — Z1231 Encounter for screening mammogram for malignant neoplasm of breast: Secondary | ICD-10-CM | POA: Diagnosis not present

## 2023-11-13 DIAGNOSIS — R61 Generalized hyperhidrosis: Secondary | ICD-10-CM | POA: Insufficient documentation

## 2023-11-13 DIAGNOSIS — Z01419 Encounter for gynecological examination (general) (routine) without abnormal findings: Secondary | ICD-10-CM | POA: Diagnosis not present

## 2023-11-16 DIAGNOSIS — F419 Anxiety disorder, unspecified: Secondary | ICD-10-CM | POA: Diagnosis not present

## 2023-11-30 DIAGNOSIS — F419 Anxiety disorder, unspecified: Secondary | ICD-10-CM | POA: Diagnosis not present

## 2023-12-14 DIAGNOSIS — F419 Anxiety disorder, unspecified: Secondary | ICD-10-CM | POA: Diagnosis not present

## 2023-12-28 DIAGNOSIS — F419 Anxiety disorder, unspecified: Secondary | ICD-10-CM | POA: Diagnosis not present

## 2023-12-29 ENCOUNTER — Other Ambulatory Visit: Payer: Self-pay | Admitting: Family Medicine

## 2023-12-29 DIAGNOSIS — N951 Menopausal and female climacteric states: Secondary | ICD-10-CM

## 2024-01-18 ENCOUNTER — Ambulatory Visit: Payer: BC Managed Care – PPO | Admitting: Family Medicine

## 2024-01-18 ENCOUNTER — Encounter: Payer: Self-pay | Admitting: Family Medicine

## 2024-01-18 VITALS — BP 113/77 | HR 60 | Temp 98.0°F | Resp 18 | Ht 62.0 in | Wt 288.3 lb

## 2024-01-18 DIAGNOSIS — R7303 Prediabetes: Secondary | ICD-10-CM | POA: Diagnosis not present

## 2024-01-18 DIAGNOSIS — E782 Mixed hyperlipidemia: Secondary | ICD-10-CM

## 2024-01-18 DIAGNOSIS — I10 Essential (primary) hypertension: Secondary | ICD-10-CM | POA: Diagnosis not present

## 2024-01-18 NOTE — Progress Notes (Signed)
   Established Patient Office Visit  Subjective   Patient ID: Angela Stanley, female    DOB: 04-13-1966  Age: 58 y.o. MRN: [id number]  Chief Complaint  Patient presents with   6 month follow up    HPI  Prediabetes Pt here for 6 month follow. Seen in Dec for her CPE. Had labs done a month prior with [facility]. Noted to have prediabetes with A1c at 6.2. Here for recheck of this.   Hyperlipidemia Pt with hx of HLD. Opted to not start statin. Taking OTC fish oil when 'I think about it.'  Hypertension Pt taking Chlorthalidone  25mg  and Nebivolol 10mg  at night for HTN. Stable on medicine. Review of Systems  All other systems reviewed and are negative.    Objective:     BP 113/77   Pulse 60   Temp 98 F (36.7 C) (Oral)   Resp 18   Ht 5\' 2"  (1.575 m)   Wt 288 lb 4.8 oz (130.8 kg)   LMP  (LMP Unknown)   SpO2 100%   BMI 52.73 kg/m  BP Readings from Last 3 Encounters:  01/18/24 113/77  07/20/23 (!) 146/86  06/23/23 129/84      Physical Exam Vitals and nursing note reviewed.  Constitutional:      Appearance: Normal appearance. She is obese.  HENT:     Head: Normocephalic and atraumatic.     Right Ear: External ear normal.     Left Ear: External ear normal.     Nose: Nose normal.     Mouth/Throat:     Mouth: Mucous membranes are moist.     Pharynx: Oropharynx is clear.  Eyes:     Conjunctiva/sclera: Conjunctivae normal.     Pupils: Pupils are equal, round, and reactive to light.  Cardiovascular:     Rate and Rhythm: Normal rate and regular rhythm.     Pulses: Normal pulses.     Heart sounds: Normal heart sounds.  Pulmonary:     Effort: Pulmonary effort is normal.     Breath sounds: Normal breath sounds.  Skin:    General: Skin is warm.     Capillary Refill: Capillary refill takes less than 2 seconds.  Neurological:     General: No focal deficit present.     Mental Status: She is alert and oriented to person, place, and time. Mental status is at baseline.   Psychiatric:        Mood and Affect: Mood normal.        Behavior: Behavior normal.        Thought Content: Thought content normal.        Judgment: Judgment normal.    No results found for any visits on 01/18/24.     The 10-year ASCVD risk score (Arnett DK, et al., 2019) is: 4.4%    Assessment & Plan:   Problem List Items Addressed This Visit   None  Mixed hyperlipidemia -     Lipid panel  Prediabetes -     Hemoglobin A1c  Essential hypertension   Recheck lipid panel and A1c today. Discussed dangers of uncontrolled HLD including strokes and /or heart attacks. Pt states will restart fish oil and take consistently. BP at goal. Continue current regimen (Chlorthalidone  25mg  daily and Nebivolol 10mg  nightly) No follow-ups on file.    Manette Section, MD

## 2024-01-19 ENCOUNTER — Other Ambulatory Visit: Payer: Self-pay | Admitting: Family Medicine

## 2024-01-19 DIAGNOSIS — G4733 Obstructive sleep apnea (adult) (pediatric): Secondary | ICD-10-CM | POA: Insufficient documentation

## 2024-01-19 DIAGNOSIS — Z860100 Personal history of colon polyps, unspecified: Secondary | ICD-10-CM | POA: Insufficient documentation

## 2024-01-19 DIAGNOSIS — J302 Other seasonal allergic rhinitis: Secondary | ICD-10-CM | POA: Insufficient documentation

## 2024-01-25 DIAGNOSIS — F419 Anxiety disorder, unspecified: Secondary | ICD-10-CM | POA: Diagnosis not present

## 2024-02-08 DIAGNOSIS — F419 Anxiety disorder, unspecified: Secondary | ICD-10-CM | POA: Diagnosis not present

## 2024-02-28 ENCOUNTER — Other Ambulatory Visit: Payer: Self-pay | Admitting: Family Medicine

## 2024-02-28 DIAGNOSIS — I10 Essential (primary) hypertension: Secondary | ICD-10-CM

## 2024-07-19 ENCOUNTER — Ambulatory Visit: Admitting: Family Medicine
